# Patient Record
Sex: Male | Born: 1955 | Race: White | Hispanic: No | Marital: Single | Smoking: Former smoker
Health system: Southern US, Community
[De-identification: ages and names within clinical notes are randomized; demographics above are authoritative.]

## PROBLEM LIST (undated history)

## (undated) ENCOUNTER — Emergency Department (HOSPITAL_COMMUNITY): Payer: Medicare Other

## (undated) DIAGNOSIS — N200 Calculus of kidney: Secondary | ICD-10-CM

## (undated) DIAGNOSIS — S21339A Puncture wound without foreign body of unspecified front wall of thorax with penetration into thoracic cavity, initial encounter: Secondary | ICD-10-CM

## (undated) DIAGNOSIS — C801 Malignant (primary) neoplasm, unspecified: Secondary | ICD-10-CM

## (undated) DIAGNOSIS — W3400XA Accidental discharge from unspecified firearms or gun, initial encounter: Secondary | ICD-10-CM

## (undated) DIAGNOSIS — F419 Anxiety disorder, unspecified: Secondary | ICD-10-CM

## (undated) DIAGNOSIS — B1920 Unspecified viral hepatitis C without hepatic coma: Secondary | ICD-10-CM

## (undated) HISTORY — DX: Accidental discharge from unspecified firearms or gun, initial encounter: W34.00XA

## (undated) HISTORY — PX: CERVICAL FUSION: SHX112

## (undated) HISTORY — DX: Anxiety disorder, unspecified: F41.9

## (undated) HISTORY — PX: SHOULDER SURGERY: SHX246

## (undated) HISTORY — PX: KNEE ARTHROSCOPY: SUR90

## (undated) HISTORY — PX: TOTAL HIP ARTHROPLASTY: SHX124

## (undated) HISTORY — PX: OTHER SURGICAL HISTORY: SHX169

## (undated) HISTORY — DX: Puncture wound without foreign body of unspecified front wall of thorax with penetration into thoracic cavity, initial encounter: S21.339A

---

## 1998-08-23 ENCOUNTER — Emergency Department (HOSPITAL_COMMUNITY): Admission: EM | Admit: 1998-08-23 | Discharge: 1998-08-23 | Payer: Self-pay | Admitting: Emergency Medicine

## 1998-09-24 ENCOUNTER — Emergency Department (HOSPITAL_COMMUNITY): Admission: EM | Admit: 1998-09-24 | Discharge: 1998-09-24 | Payer: Self-pay

## 2001-07-02 ENCOUNTER — Encounter: Payer: Self-pay | Admitting: Orthopedic Surgery

## 2001-07-02 ENCOUNTER — Encounter: Admission: RE | Admit: 2001-07-02 | Discharge: 2001-07-02 | Payer: Self-pay | Admitting: Orthopedic Surgery

## 2001-11-04 ENCOUNTER — Encounter: Payer: Self-pay | Admitting: Orthopedic Surgery

## 2001-11-08 ENCOUNTER — Encounter: Payer: Self-pay | Admitting: Orthopedic Surgery

## 2001-11-08 ENCOUNTER — Inpatient Hospital Stay (HOSPITAL_COMMUNITY): Admission: RE | Admit: 2001-11-08 | Discharge: 2001-11-11 | Payer: Self-pay | Admitting: Orthopedic Surgery

## 2002-02-26 ENCOUNTER — Ambulatory Visit (HOSPITAL_COMMUNITY): Admission: RE | Admit: 2002-02-26 | Discharge: 2002-02-26 | Payer: Self-pay | Admitting: Orthopedic Surgery

## 2002-02-26 ENCOUNTER — Encounter: Payer: Self-pay | Admitting: Orthopedic Surgery

## 2002-04-01 ENCOUNTER — Encounter: Payer: Self-pay | Admitting: Emergency Medicine

## 2002-04-01 ENCOUNTER — Emergency Department (HOSPITAL_COMMUNITY): Admission: EM | Admit: 2002-04-01 | Discharge: 2002-04-01 | Payer: Self-pay | Admitting: Emergency Medicine

## 2002-07-15 ENCOUNTER — Emergency Department (HOSPITAL_COMMUNITY): Admission: AD | Admit: 2002-07-15 | Discharge: 2002-07-15 | Payer: Self-pay | Admitting: Emergency Medicine

## 2002-07-15 ENCOUNTER — Encounter: Payer: Self-pay | Admitting: Emergency Medicine

## 2002-10-06 ENCOUNTER — Ambulatory Visit (HOSPITAL_BASED_OUTPATIENT_CLINIC_OR_DEPARTMENT_OTHER): Admission: RE | Admit: 2002-10-06 | Discharge: 2002-10-06 | Payer: Self-pay | Admitting: Orthopedic Surgery

## 2003-09-01 ENCOUNTER — Emergency Department (HOSPITAL_COMMUNITY): Admission: EM | Admit: 2003-09-01 | Discharge: 2003-09-01 | Payer: Self-pay

## 2004-02-21 ENCOUNTER — Emergency Department (HOSPITAL_COMMUNITY): Admission: EM | Admit: 2004-02-21 | Discharge: 2004-02-21 | Payer: Self-pay | Admitting: Family Medicine

## 2004-02-25 ENCOUNTER — Emergency Department (HOSPITAL_COMMUNITY): Admission: EM | Admit: 2004-02-25 | Discharge: 2004-02-25 | Payer: Self-pay | Admitting: Family Medicine

## 2004-05-16 ENCOUNTER — Emergency Department (HOSPITAL_COMMUNITY): Admission: EM | Admit: 2004-05-16 | Discharge: 2004-05-16 | Payer: Self-pay | Admitting: Family Medicine

## 2004-06-17 ENCOUNTER — Ambulatory Visit (HOSPITAL_COMMUNITY): Admission: RE | Admit: 2004-06-17 | Discharge: 2004-06-17 | Payer: Self-pay | Admitting: Orthopedic Surgery

## 2004-08-02 ENCOUNTER — Emergency Department (HOSPITAL_COMMUNITY): Admission: EM | Admit: 2004-08-02 | Discharge: 2004-08-02 | Payer: Self-pay | Admitting: Family Medicine

## 2004-11-16 ENCOUNTER — Emergency Department (HOSPITAL_COMMUNITY): Admission: EM | Admit: 2004-11-16 | Discharge: 2004-11-16 | Payer: Self-pay | Admitting: Emergency Medicine

## 2004-12-20 ENCOUNTER — Ambulatory Visit: Payer: Self-pay | Admitting: Physical Medicine & Rehabilitation

## 2004-12-20 ENCOUNTER — Inpatient Hospital Stay (HOSPITAL_COMMUNITY): Admission: RE | Admit: 2004-12-20 | Discharge: 2004-12-25 | Payer: Self-pay | Admitting: Orthopedic Surgery

## 2005-05-02 ENCOUNTER — Ambulatory Visit (HOSPITAL_BASED_OUTPATIENT_CLINIC_OR_DEPARTMENT_OTHER): Admission: RE | Admit: 2005-05-02 | Discharge: 2005-05-02 | Payer: Self-pay | Admitting: Orthopedic Surgery

## 2005-10-15 ENCOUNTER — Ambulatory Visit: Payer: Self-pay | Admitting: Internal Medicine

## 2005-11-12 ENCOUNTER — Ambulatory Visit: Payer: Self-pay | Admitting: Internal Medicine

## 2005-11-12 ENCOUNTER — Encounter (INDEPENDENT_AMBULATORY_CARE_PROVIDER_SITE_OTHER): Payer: Self-pay | Admitting: *Deleted

## 2006-06-17 ENCOUNTER — Emergency Department (HOSPITAL_COMMUNITY): Admission: EM | Admit: 2006-06-17 | Discharge: 2006-06-18 | Payer: Self-pay | Admitting: Emergency Medicine

## 2006-07-02 ENCOUNTER — Ambulatory Visit (HOSPITAL_COMMUNITY): Admission: RE | Admit: 2006-07-02 | Discharge: 2006-07-02 | Payer: Self-pay | Admitting: Specialist

## 2006-08-06 ENCOUNTER — Encounter: Admission: RE | Admit: 2006-08-06 | Discharge: 2006-08-06 | Payer: Self-pay | Admitting: Specialist

## 2006-08-25 ENCOUNTER — Ambulatory Visit: Payer: Self-pay | Admitting: Gastroenterology

## 2006-08-27 ENCOUNTER — Encounter: Admission: RE | Admit: 2006-08-27 | Discharge: 2006-11-25 | Payer: Self-pay | Admitting: Specialist

## 2006-09-28 ENCOUNTER — Ambulatory Visit (HOSPITAL_COMMUNITY): Admission: RE | Admit: 2006-09-28 | Discharge: 2006-09-28 | Payer: Self-pay | Admitting: Gastroenterology

## 2006-11-05 ENCOUNTER — Encounter
Admission: RE | Admit: 2006-11-05 | Discharge: 2006-11-06 | Payer: Self-pay | Admitting: Physical Medicine & Rehabilitation

## 2006-11-06 ENCOUNTER — Ambulatory Visit: Payer: Self-pay | Admitting: Physical Medicine & Rehabilitation

## 2007-03-28 ENCOUNTER — Emergency Department (HOSPITAL_COMMUNITY): Admission: EM | Admit: 2007-03-28 | Discharge: 2007-03-28 | Payer: Self-pay | Admitting: Emergency Medicine

## 2007-04-01 ENCOUNTER — Encounter: Payer: Self-pay | Admitting: Emergency Medicine

## 2007-04-01 ENCOUNTER — Ambulatory Visit: Payer: Self-pay | Admitting: *Deleted

## 2007-04-01 ENCOUNTER — Inpatient Hospital Stay (HOSPITAL_COMMUNITY): Admission: RE | Admit: 2007-04-01 | Discharge: 2007-04-03 | Payer: Self-pay | Admitting: *Deleted

## 2007-08-05 ENCOUNTER — Emergency Department (HOSPITAL_COMMUNITY): Admission: EM | Admit: 2007-08-05 | Discharge: 2007-08-05 | Payer: Self-pay | Admitting: Emergency Medicine

## 2007-11-23 ENCOUNTER — Inpatient Hospital Stay (HOSPITAL_COMMUNITY): Admission: RE | Admit: 2007-11-23 | Discharge: 2007-11-24 | Payer: Self-pay | Admitting: Neurological Surgery

## 2007-12-08 ENCOUNTER — Emergency Department (HOSPITAL_COMMUNITY): Admission: EM | Admit: 2007-12-08 | Discharge: 2007-12-08 | Payer: Self-pay | Admitting: Emergency Medicine

## 2008-01-29 ENCOUNTER — Emergency Department (HOSPITAL_COMMUNITY): Admission: EM | Admit: 2008-01-29 | Discharge: 2008-01-29 | Payer: Self-pay | Admitting: Emergency Medicine

## 2008-02-21 ENCOUNTER — Ambulatory Visit: Payer: Self-pay | Admitting: Psychiatry

## 2008-02-21 ENCOUNTER — Inpatient Hospital Stay (HOSPITAL_COMMUNITY): Admission: RE | Admit: 2008-02-21 | Discharge: 2008-02-23 | Payer: Self-pay | Admitting: Psychiatry

## 2008-05-16 ENCOUNTER — Emergency Department (HOSPITAL_COMMUNITY): Admission: EM | Admit: 2008-05-16 | Discharge: 2008-05-16 | Payer: Self-pay | Admitting: Family Medicine

## 2009-08-03 ENCOUNTER — Emergency Department (HOSPITAL_COMMUNITY): Admission: EM | Admit: 2009-08-03 | Discharge: 2009-08-03 | Payer: Self-pay | Admitting: Emergency Medicine

## 2009-09-13 ENCOUNTER — Emergency Department (HOSPITAL_COMMUNITY): Admission: EM | Admit: 2009-09-13 | Discharge: 2009-09-13 | Payer: Self-pay | Admitting: Emergency Medicine

## 2010-02-24 ENCOUNTER — Encounter: Payer: Self-pay | Admitting: Gastroenterology

## 2010-05-20 LAB — CBC
HCT: 42.8 % (ref 39.0–52.0)
Hemoglobin: 14.5 g/dL (ref 13.0–17.0)
MCHC: 33.8 g/dL (ref 30.0–36.0)
MCV: 94.6 fL (ref 78.0–100.0)
Platelets: 141 10*3/uL — ABNORMAL LOW (ref 150–400)
RBC: 4.53 MIL/uL (ref 4.22–5.81)
RDW: 14.4 % (ref 11.5–15.5)
WBC: 4.4 10*3/uL (ref 4.0–10.5)

## 2010-05-20 LAB — COMPREHENSIVE METABOLIC PANEL
ALT: 43 U/L (ref 0–53)
AST: 30 U/L (ref 0–37)
Albumin: 3.6 g/dL (ref 3.5–5.2)
Alkaline Phosphatase: 65 U/L (ref 39–117)
BUN: 11 mg/dL (ref 6–23)
CO2: 28 mEq/L (ref 19–32)
Calcium: 8.9 mg/dL (ref 8.4–10.5)
Chloride: 105 mEq/L (ref 96–112)
Creatinine, Ser: 0.73 mg/dL (ref 0.4–1.5)
GFR calc Af Amer: >60 mL/min (ref >60)
GFR calc non Af Amer: >60 mL/min (ref >60)
Glucose, Bld: 98 mg/dL (ref 70–99)
Potassium: 4.4 mEq/L (ref 3.5–5.1)
Sodium: 141 mEq/L (ref 135–145)
Total Bilirubin: 0.5 mg/dL (ref 0.3–1.2)
Total Protein: 6.8 g/dL (ref 6.0–8.3)

## 2010-05-20 LAB — TSH: TSH: 1.8 u[IU]/mL (ref 0.350–4.500)
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
## 2010-06-18 NOTE — H&P (Signed)
NAMESAYAN, ALDAVA            ACCOUNT NO.:  [id number]   MEDICAL RECORD NO.:  [id number]          PATIENT TYPE:  IPS   LOCATION:  0400                          [facility]ILITY[facility]   PHYSICIAN:  Anselm Jungling, MD  DATE OF BIRTH:  [birth year]   DATE OF ADMISSION:  02/21/2008  DATE OF DISCHARGE:                       PSYCHIATRIC ADMISSION ASSESSMENT   This is a 55 year old male, voluntarily admitted on February 21, 2008.   HISTORY OF PRESENT ILLNESS:  The patient is here for history of alcohol  abuse and dependence, drinking up to 18 beers daily or 3 to 4 bottles of  chardonnay.  Last drink was on February 21, 2008.  He wants to get help  for his alcohol use and asking for medications to help with cravings or  Antabuse.  He denies any depression or suicidal thoughts and denies any  other substance use.   PAST PSYCHIATRIC HISTORY:  This is his second admission.  He was here in  February of 2009 for depression and was detoxed off of Xanax at that  time.   SOCIAL HISTORY:  A 55 year old male who lives in [place].  Is  divorced and on disability.   FAMILY HISTORY:  Unknown.   HABITS:  Alcohol and drug history:  Again as above.  Denies any  substance use.  Denies any IV drug use.  Primary care Adelie Croswell is Dr.  Clelia Croft.   PAST MEDICAL PROBLEMS:  1. Had a cervical decompression of C3-C4, C4-C5 in October of 2009.  2. Also has a history of bilateral hip replacements in 2004 and 2006.   Denies any other chronic health conditions.   MEDICATIONS:  None prior to arrival.   DRUG ALLERGIES:  1. SULFA.  2. OXYCODONE.  3. CODEINE.   REVIEW OF SYSTEMS:  Significant for neck pain and unsteady gait.  He  does deny any chest pain, shortness of breath, weight changes, nausea,  vomiting, dysuria, or seizures.   PHYSICAL EXAMINATION:  GENERAL APPEARANCE:  This is a middle-aged male  with a ruddy appearance, appears well-nourished and in no acute  distress.  VITAL SIGNS:  Temperature 97.1,  70 heart rate, 18 respirations, blood  pressure is 154/85, 6 feet tall, 233 pounds.  HEAD, NOSE, AND THROAT:  Ruddy appearance and atraumatic.  NECK:  Limited range of motion.  CHEST:  Clear with no wheezing or rales.  BREASTS:  Deferred.  HEART:  Regular rate and rhythm.  No murmurs or gallops.  ABDOMEN:  Soft, nondistended, and nontender abdomen.  PELVIC AND GU:  Deferred.  EXTREMITIES:  The patient moves all upper and lower extremities with no  tremors noted.  SKIN:  Warm and dry with again a ruddy facial appearance.  NEUROLOGICAL:  Intact and nonfocal.  No tics, no tremors.   LABORATORY DATA:  Shows a C-met within normal limits.  A platelet count  mildly low at 141 with a reference range of 150 to 400.   MENTAL STATUS EXAM:  The patient is fully alert and cooperative.  Good  eye contact.  Speech is clear and normal pace.  He is appropriately  dressed.  Mood is  neutral.  The patient's affect is pleasant and  cooperative.  Thought processes are coherent and goal directed.  Cognitive function intact.  His memory appears to good.  Judgment and  insight are fair.  Poor impulse control related to alcohol use.   ASSESSMENT:  Axis I:  Alcohol dependence, alcohol withdrawal.  Axis II:  Deferred.  Axis III:  Status post cervical decompression to the neck.  Axis IV:  Possible medical problems and other psychosocial issues.  Will  continue to explore his psychosocial impact on illness.  Axis V:  Current is 40.   PLAN:  Detox the patient with Librium protocol.  Work on relapse  prevention.  Will consider Campral, Antabuse, or Revia.  Case manager  will assess and follow up with rehab and relapse prevention.  The  patient be in the red group. We will also monitor the patient's blood  pressure.  Discussion with the patient was done.  The patient reports  having high blood pressure at times, but not being on any medication.  We will again monitor his blood pressure frequently through his  hospital  stay and the patient was advised to follow up with Dr. Clelia Croft if blood  pressure remains elevated.  His tentative length of stay at this time is  3 to 5 days.      Landry Corporal, N.P.      Anselm Jungling, MD  Electronically Signed    JO/MEDQ  D:  02/22/2008  T:  02/22/2008  Job:  [id number]

## 2010-06-18 NOTE — Discharge Summary (Signed)
[name], [name]            ACCOUNT NO.:  [id number]   MEDICAL RECORD NO.:  [id number]          PATIENT TYPE:  IPS   LOCATION:  0300                          [facility]ACILITY:  [facility]   PHYSICIAN:  [name], M.D. DATE OF BIRTH:  Jun 03, 1955   DATE OF ADMISSION:  02/21/2008  DATE OF DISCHARGE:  02/23/2008                               DISCHARGE SUMMARY   IDENTIFICATION:  This is a 55 year old divorced white male who was  admitted on a voluntary basis on February 21, 2008.   HISTORY OF PRESENT ILLNESS:  The patient is here for history of alcohol  abuse and dependence, drinking up to 18 beers daily or 3 to 4 bottles of  chardonnay.  His last drink was on February 21, 2008.  He wants to get  help for his alcohol use and was asking for medications to help with  cravings were Antabuse.  He denies any depression or suicidal thoughts  and denied any other substance use.   PAST PSYCHIATRIC HISTORY:  This is the his 2nd admission.  He was here  in February 2009 for depression and was detoxed off Xanax at that time.  He has had no current treatment.   FAMILY HISTORY:  Unknown.   ALCOHOL AND DRUG HISTORY:  As above.  He denies any substance use.  He  denies any IV drug use.   PAST MEDICAL PROBLEMS.:  1. He had a cervical decompression of C3-C4 and C4-C5 in October 2009.  2. Also has a history of bilateral hip replacements in 2004 and in      2006.  3. Denies any other chronic health conditions.   MEDICATIONS:  None prior to arrival.   DRUG ALLERGIES:  1. SULFA.  2. OXYCODONE.  3. CODEINE.   PHYSICAL FINDINGS:  There were no acute physical or medical problems  noted.   LABORATORY DATA:  Shows a C-MET within normal limits.  Platelet count  mildly low at 141 with a reference range of 150 to 400.   HOSPITAL COURSE:  Upon admission, the patient was started on trazodone  50 mg p.o. q.h.s. p.r.n. insomnia.  He was also started on Librium detox  protocol, though he refused to take this  after 2 doses.  He did not feel  he needed to be withdrawn from alcohol since he states he just binge  drinks.  There were no symptoms of withdrawal.  He was alert, pleasant,  nontremulous.  Mood was neutral.  Affect appropriate.  He wants help.  He wanted the Antabuse.  On February 23, 2008, the patient reiterated he  did not want to take the Librium.  He does not feel he drinks frequently  enough to require detox.  He admits to binge drinking.  He wants to go  home today because he has a court hearing tomorrow for DUI.  He was  started on Antabuse 250 mg per day at his request.  It was felt he was  safe for discharge.  Appetite was good.  Sleep was fair.  Mood was  euthymic.  Affect consistent with mood.  There was no suicidal  or  homicidal ideation.  No thoughts of self-injurious behavior.  No  auditory or visual hallucinations.  No paranoia or delusions.  Thoughts  were logical and goal-directed.  Thought content no predominant theme.  Cognitive was grossly intact.  He planned for his son to pick him up.  He stated he was going to attend AA meetings regularly.  He was also  referred to the [name].   DISCHARGE DIAGNOSES:  Axis I:  Alcohol dependence.  Axis II:  None.  Axis III:  Status post cervical decompression to the neck.  Axis IV:  Moderate (medical problems and other psychosocial issues,  burden of chemical dependence).  Axis V:  Global assessment of functioning was 50 upon discharge.  GAF  was 40 upon admission.  GAF highest past year was 65.   DISCHARGE PLANS:  There was no specific activity level or dietary  restrictions.   POSTHOSPITAL CARE PLANS:  The patient will go to the [name] for  their IOP program on January 21st at 9 a.m.  He will also go to [name] daily.   DISCHARGE MEDICATIONS:  He needs a follow up with Dr. Sherryll Byrd, his primary  care doctor for his blood pressure, which was somewhat elevated.  Antabuse 250 mg p.o. q.a.m.      [name], M.D.  Electronically Signed     BHS/MEDQ  D:  02/23/2008  T:  02/24/2008  Job:  [id number]

## 2010-06-18 NOTE — H&P (Signed)
[name], [name]            ACCOUNT NO.:  [id number]   MEDICAL RECORD NO.:  [id number]           PATIENT TYPE:   LOCATION:                                 FACILITY:   PHYSICIAN:  Jasmine Pang, M.D. DATE OF BIRTH:  1955-07-22   DATE OF ADMISSION:  04/02/2007  DATE OF DISCHARGE:                       PSYCHIATRIC ADMISSION ASSESSMENT   A 55 year old male who was voluntarily admitted on April 01, 2007.   HISTORY OF PRESENT ILLNESS:  The patient reports that he has been  feeling depressed, having problems with anxiety.  He states that his  anxiety has been worsening over the past week.  Last week, due to his  panic attacks, he had taken some extra Xanax to help out with his panic  attacks.  He states that it was a stupid thing to do.  He has been  tapering off his Xanax.  He no longer wanted to be on this medication,  and states that he is down to 1 mg daily.  He has been having trouble  sleeping.  He was having some suicidal thoughts, trying to get off the  Xanax.  He states that when he was trying to get off the Xanax, he was  having such panic attacks he walked from [place] to [place] during  a panic attack.  He denies any hallucinations.  He feels he is not to be  detoxed at this time from his medications.   PAST PSYCHIATRIC HISTORY:  First admission to the [facility].  He was seen in the emergency room department on February 22  after he had to took 6 Xanax tablets and was released.  He does not see  a psychiatrist or therapist outpatient.   SOCIAL HISTORY:  He is a divorced male, disabled.  Per chart has a DUI  with a court date pending in March.  He states that he has good social  support, has 6 grandchildren.   FAMILY HISTORY:  None that we are aware of.   SOCIAL HISTORY:  The patient smokes.  Denies any alcohol or drug use.  Primary care [name] is [facility].  Sees Dr.  __________ . 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   MEDICAL PROBLEMS:  The patient reports multiple orthopedic surgeries to  his hips, knees, and shoulder.   MEDICATIONS:  Has been on Xanax 20 mg q.i.d.  The patient has been  tapering himself down to 1 mg daily.  He takes folic acid tablet.   DRUG ALLERGIES:  SULFA, OXYCODONE, and CODEINE.   PHYSICAL EXAM:  GENERAL: This is a middle-aged male who was assessed in  the emergency room.  VITAL SIGNS:  Temperature is 97.8, heart rate 67, respirations 18, blood  pressure is 156/88.  He is 6 feet 2 inches tall, 218 pounds.  ER notes,  the patient has swollen lymph nodes.  The nursing assessment indicates  that the patient has multiple tattoos.  CBC shows a platelet count of  127.  Urinalysis is negative.  Urine drug screen is positive for  benzodiazepines.  Alcohol level less than 5, glucose of 146.  An x-ray  of his right arm was done.  The patient had reported a prior fracture to  his right arm.  Right elbow shows no fracture.  There are some mild  changes.   MENTAL STATUS EXAM:  He is a middle-aged male fully alert, good eye  contact, casually dressed.  Speech is clear, normal pace and tone.  Affect:  The patient initially is somewhat irritable about his  medications.  As the interview progressed the patient had a sense humor,  was agreeable to having monitoring the patient's symptoms, and was  agreeable to case managers suggestions for a therapist.  Thought process  are coherent.  No evidence any thought disorder.  Cognitive function  intact.  His memory appears to be good.  Judgment and insight is fair.   IMPRESSION:  AXIS I:  Anxiety disorder, not otherwise specified.  AXIS II:  Deferred.  AXIS III:  Multiple orthopedic surgeries.  AXIS IV:  Problems with occupation, medical problems.  AXIS V:  Current is 45-50.   PLANS:  To contract for safety.  Stabilize mood thinking.  Initially the  patient was detoxed.  We will note  to have Librium available on a p.r.n.  basis.  The patient was informed of ER noting swollen neck, and the  patient was informed that he will need to follow up with his primary  care [name] for further evaluation.  The patient appears to  understand.   TENTATIVE LENGTH OF STAY:  2-3 days.      Landry Corporal, N.P.      Jasmine Pang, M.D.  Electronically Signed    JO/MEDQ  D:  04/02/2007  T:  04/03/2007  Job:  [id number]

## 2010-06-18 NOTE — Op Note (Signed)
NAMEMAL, ASHER            ACCOUNT NO.:  [id number]   MEDICAL RECORD NO.:  [id number]          PATIENT TYPE:  INP   LOCATION:  3526                         FACILITY:  [facility]   PHYSICIAN:  Stefani Dama, M.D.  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   OPERATIONS:  Anterior cervical decompression at C3-C4, C4-C5, and C5-C6;  arthrodesis with structural allograft, Alphatec plate fixation with  Trestle.   INDICATIONS:  [name] is a 55 year old individual who has had  significant problems with chronic neck pain, dysesthesias, and numbness  in the upper extremities and myelopathic symptoms in the lower  extremities.  He has been evaluated with an MRI of the cervical spine  that shows that he has evidence of compromise of the spinal canal at C3-  C4, C4-C5, and C5-C6 with biforaminal stenosis noted at C4-C5 and C5-C6.  He has been advised regarding surgical decompression and arthrodesis at  these levels.   PROCEDURE:  The patient was brought to the operating room supine on the  stretcher.  After the smooth induction of general endotracheal  anesthesia, he was placed in 5 pounds of traction.  The neck was then  prepped with alcohol and DuraPrep and draped in the sterile fashion.  A  transverse incision was created in the left side of the neck and carried  down through the platysma plane between the sternocleidomastoid and  strap muscles and dissected bluntly until the prevertebral space was  reached.  The first identifiable disk space was noted be that of C4-C5  on localizing radiograph.  Dissection was then carried in the longus  coli muscle up to C3-C4 and down  to C5-C6.  Caspar retractor was placed  in the wound.  Anterior longitudinal ligament was opened at C4-C5 and  diskectomy was performed at this level using a combination of curettes,  rongeurs, and then a high-speed bur to bur off significant uncinate  process hypertrophy on either lateral aspect.  The posterior  longitudinal ligament was then opened and decompression was taken out  into the foramen on either side.  Once these areas were secured and  hemostasis was achieved, then the C5 nerve root was well decompressed.  The interspace was sized for an appropriate fitting bone graft and a  transgraft from Alphatec was shaved from the 7-mm size to fit  appropriately into the interspace.  It was filled then with  demineralized bone matrix and tamped into the interspace and countersunk  slightly.  Attention was then turned to C3-C4 where similar procedure  was carried out.  The findings were similar and that there was some  hypertrophy in the uncinate processes on either side and the posterior  longitudinal ligament was noted be thickened.  The area was decompressed  in the same fashion.  The same 7-mm size graft was placed  into the  interspace and countersunk appropriately.  Attention was then turned to  C5-C6 where similar procedure was carried out.  Here, there was noted to  be some substantially more uncinate hypertrophy in the lateral gutters  and this required significant drilling to decompress the C6 nerve roots  in the lateral portion of the space.  The posterior central portion was  decompressed similarly and the posterior longitudinal ligament was  opened completely from side to side.  Hemostasis was again achieved and  here an 8-mm size Alphatec graft was then shaved to the appropriate size  and position and filled with demineralized bone matrix, placed into the  interspace and countersunk appropriately.  Traction was removed at this  point and then a 54-mm Trestle plate was fitted  to the ventral aspect of  the vertebral bodies and secured sequentially with 14-mm variable angle  screws from C3 down to C6.  Once this was finally locked down into  position, the wound was checked for hemostasis and a final localizing  radiograph identified good  position of the surgical hardware and the platysma was closed with 3-0  Vicryl in an interrupted fashion and 3-0 Vicryl subcuticular tissues.  Blood loss for the procedure was estimated 100 mL.  The patient  tolerated the procedure well and was returned to the recovery room in  stable condition.      Stefani Dama, M.D.  Electronically Signed     HJE/MEDQ  D:  11/23/2007  T:  11/24/2007  Job:  [id number]

## 2010-06-21 NOTE — Op Note (Signed)
NAMEDESMEN, SCHOFFSTALL            ACCOUNT NO.:  [id number]   MEDICAL RECORD NO.:  [id number]          PATIENT TYPE:  INP   LOCATION:  0007                         FACILITY:  [facility]   PHYSICIAN:  Ollen Gross, M.D.    DATE OF BIRTH:  [birth year]   DATE OF PROCEDURE:  12/20/2004  DATE OF DISCHARGE:                                 OPERATIVE REPORT   PREOPERATIVE DIAGNOSIS:  Osteoarthritis, left hip.   POSTOPERATIVE DIAGNOSIS:  Osteoarthritis, left hip.   PROCEDURE:  Left total hip arthroplasty.   SURGEON:  Ollen Gross, M.D.   ASSISTANT:  Avel Peace, P.A.-C.   ANESTHESIA:  General.   ESTIMATED BLOOD LOSS:  2000.   DRAINS:  Hemovac x1.   COMPLICATIONS:  None.   CONDITION:  Stable to recovery.   BRIEF CLINICAL NOTE:  [name] is a 55 year old male who has had a previous  right total hip arthroplasty who presented with significant right hip pain  and mechanical symptoms and underwent a left hip arthroscopy. He did well  initially but then he had multiple falls due to his right leg giving out. He  has gone on to develop more degenerative change in the left hip. At the time  of arthroscopy, he had very large chondral defects. Due to the progressively  worsening pain and worsening changes in the hip at this point, he presents  now for a total hip arthroplasty.   PROCEDURE IN DETAIL:  After successful administration of general anesthetic,  the patient is placed in the right lateral decubitus position with the left  side up and held with a hip positioner. The left lower extremity is isolated  from his perineum with plastic drapes and prepped and draped in the usual  sterile fashion. A standard posterolateral incision is made with a 10 blade  through the tissue to the level of the fascia lata which was incised in line  with the skin incision. The sciatic nerve was palpated and protected and the  short rotators isolated off the femur. Upon isolating the rotators, we  encountered a  tremendous amount of high-volume bleeding. There was a large  varicoele vein in the quadratus femoris muscle. He rapidly lost over a liter  of blood within a minute.  I was able to identify the source and then we  cauterized it and effectively coagulated it. He was stable throughout this.  We then performed a capsulectomy and subsequently dislocated the hip. The  trial prosthesis was placed such that the center of the trial head  corresponds to the center of his native femoral head. The osteotomy line is  marked on the femoral neck and osteotomy made with an oscillating saw. The  femoral head is removed and the femur retracted anteriorly to gain  acetabular exposure. Acetabular reaming starts at a 47 coursing in  increments of 2 mm up to 59 mm and then a 60 mm pinnacle shell was placed in  anatomic position with excellent purchase. We then placed three additional  dome screws with excellent purchase. The trial 36 mm neutral liner was then  placed.   The femur was repaired  with the canal finder and the irrigation. Axial  reaming was performed to 15.5 mm, proximal reaming to a 20D and the sleeve  machined to a large. The 20D large trial sleeve is placed and a 20 x 15 stem  and a 36 plus 8 neck. With the plus 8 we noted he did not have enough  offset, we went to a 36 plus 12. We matched his native anteversion. The 36  plus 0 head is placed. On his other side, he was a lengthened quite a bit.  With the 36 plus 0 head, we did not effectively get him back to the length  that he was on the other side. We went with a 36 plus 3 head which got him  more appropriately tensioned. He had great stability, full extension, full  external rotation, 70 degrees flexion, 40 degrees adduction, 90 degrees  internal rotation and 90 degrees of flexion and about 50 degrees of internal  rotation. By placing the left leg on top of the right, they were very close  to being equal. The trials were all removed and then  the permanent apex hole  eliminator is placed into the acetabular shell. The permanent 36 mm neutral  Ultamet metal liner is placed. It is a metal-on-metal hip replacement. The  20D large sleeve is then placed in the proximal femur with a 20 x 15 stem  and a 36 plus 12 neck. Again we matched the native anteversion. A 36 plus 3  head is placed and the hip is reduced with the same stability parameters.  The wound was copiously irrigated with saline solution. We checked to make  sure that adequate coagulation had been achieved and at this point it was.  We placed some Gelfoam down at the area where that previous varicoele vein  was. The rotators then reattached to the femur through drill holes with  Ethibond suture. The fascia lata was closed over a Hemovac drain with  interrupted #1 Vicryl, subcu closed with #1 and #2-0 Vicryl and subcuticular  running 4-0 Monocryl. The incision was cleaned and dried and the drain  hooked to suction. A bulky sterile dressing is applied and he is placed into  a knee immobilizer, awakened and transported to recovery in stable  condition.      Ollen Gross, M.D.  Electronically Signed     FA/MEDQ  D:  12/20/2004  T:  12/20/2004  Job:  [id number]

## 2010-06-21 NOTE — Discharge Summary (Signed)
[name], [name]            ACCOUNT NO.:  [id number]   MEDICAL RECORD NO.:  [id number]          PATIENT TYPE:  IPS   LOCATION:  0300                          FACILITY:  [facility]   PHYSICIAN:  Jasmine Pang, M.D. DATE OF BIRTH:  May 10, 1955   DATE OF ADMISSION:  02/21/2008  DATE OF DISCHARGE:  02/23/2008                               DISCHARGE SUMMARY   IDENTIFICATION:  This is a 55 year old divorced male who was admitted on  a voluntary basis on February 21, 2008.   HISTORY OF PRESENT ILLNESS:  The patient is here for a history of  alcohol abuse and dependence, drinking up to 18 beers daily or 3 to 4  bottles of chardonnay; last drink was on February 21, 2008.  He wants to  get help for his alcohol use and is asking for medications to help with  cravings or Antabuse.  He denies any depression or suicidal thoughts.  He denies any other substance use.   PAST PSYCHIATRIC HISTORY:  This is his second admission.  He was here in  February 2009 for depression, was detoxed off Xanax at that time.   FAMILY HISTORY:  Unknown.   ALCOHOL AND DRUG HISTORY:  Again as above.  He denies any substance use.  He denies any IV drug use.   PAST MEDICAL PROBLEMS:  The patient has cervical decompression of C3  through C4 and C4 through C5 in October 2009.  He also has a history of  bilateral hip replacements in 2004 and 2006.  He denies any other  chronic health conditions.   MEDICATIONS:  None prior to arrival.   DRUG ALLERGIES:  1. SULFA.  2. OXYCODONE.  3. CODEINE.   PHYSICAL FINDINGS:  There were no acute physical or medical problems  noted.   Laboratory data shows a CMET within normal limits.  A platelet count  mildly low at 141 with reference range of 150-400.   HOSPITAL COURSE:  Upon admission, the patient was placed on trazodone 50  mg p.o. q.h.s. p.r.n. insomnia and the Librium detox protocol.  He  tolerated these medications well with no significant side effects.  In  individual  sessions, the patient was friendly and cooperative.  He  stated he was here for alcohol detox.  He was pleasant, nontremulous.  There were no signs of psychosis.  No suicidal ideation.  He wants help.  He wants Antabuse.  On February 23, 2008, mental status had improved  markedly from admission status.  He stated he did not want to take  Librium.  He does not feel that he drinks frequently enough to require  detox.  He admits to more of a binge drinking pattern.  He wanted to go  home today because he has a court hearing the following day for DUI.  Sleep was fair.  Appetite was good.  Mood was euthymic.  Affect  consistent with mood.  There was no suicidal or homicidal ideation.  No  auditory or visual hallucinations.  No paranoia or delusions.  Thoughts  were logical and goal-directed, thought content.  No predominant theme.  Cognitive was  grossly intact.  Insight good, judgment good, impulse  control good.  It was decided to prescribe Antabuse 250 mg daily at his  request.   DISCHARGE DIAGNOSES:  Axis I:  Alcohol dependence.  Axis II:  None.  Axis III:  Status post cervical decompression to the neck.  Axis IV:  Moderate (possible medical problems and other psychosocial  issues).  Axis V:  Global assessment of functioning was 50 on discharge.  GAF was  40 upon admission.  GAF highest past year was 60-65.   DISCHARGE PLAN:  There was no specific activity level or dietary  restrictions.   POSTHOSPITAL CARE PLANS:  The patient will go to the [facility] on February 24, 2008, at 9 a.m. for a walk-in appointment.  He will  also follow up with Dr. Jabier Mutton for blood pressure.   DISCHARGE MEDICATIONS:  Antabuse 250 mg daily in the a.m.      Jasmine Pang, M.D.  Electronically Signed     BHS/MEDQ  D:  03/24/2008  T:  03/25/2008  Job:  [id number]

## 2010-06-21 NOTE — Op Note (Signed)
[name], [name]                        ACCOUNT NO.:  [id number]   MEDICAL RECORD NO.:  [id number]                   PATIENT TYPE:  INP   LOCATION:  2873                                 FACILITY:  [facility]   PHYSICIAN:  Harvie Junior, M.D.                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   OPERATION PERFORMED:  Right total hip replacement with SROM instrumentation  36 head with metal on metal pinnacle shell 56 mm.   SURGEON:  Harvie Junior, M.D.   ASSISTANT:  1. Feliberto Gottron. Turner Daniels, M.D.  2. Currie Paris. Thedore Mins.   ANESTHESIA:  General.   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  He ultimately has been living with this and dealing with  it for a long period of time with pain for greater than 15 years.  The pain  has continued to increase over the last several years and because of  continued complaints of worsening of pain, the patient was ultimately  evaluated and felt to need some sort of surgical procedure.  We talked about  cuneiform style osteotomy, the unpredictable nature of that in terms of  success.  We brought him in and evaluated him at length and tried to obtain  a pain free position.  We really could not find a pain free position.  Because we could not find a pain free position, it was unclear what kind of  osteotomy would allow him to get into a pain free position.  We talked about  further treatment options.  It was felt that nonoperative treatment was  really not something he could  continue to endure painwise and so ultimately  we talked about doing a total hip replacement and his decision was to go  with this.  We talked about bearing options given his young age.  We talked  about metal on metal.  We talked about the problems of metal on metal with  concerns of a metal ion layer over time.  We talked about metal on  polyethylene as the gold standard.  We talked about ceramic hip, ceramic on  ceramic.  Ultimately, after discussion, he said that he would like a metal  on metal hip as he thought that this was more like a ball bearing and he  felt that ball bearing certainly not wear out and again we discussed the  metal ion situation with him, unknown about the possibility of cancer  causing agents and he ultimately understood this risk and his decision to  proceed with a metal on metal total hip.  He was brought to the operating  room for this procedure.   DESCRIPTION OF PROCEDURE:  The patient was brought to the operating room  where adequate anesthesia was obtained with general anesthetic.  The patient  was placed into the left lateral decubitus position.  All bony prominences  were well padded.  At this point attention was turned to the right hip where  after routine prep and drape an incision was made for a posterior approach  to the hip.  Subcutaneous tissues were dissected down to the level of the  tensor fascia which was divided in line with the fibers.  Gluteus maximus  was finger fractured.  The posterior capsule was then identified.  The  posterior  capsular structures were then opened.  The piriformis tendon was  taken down.  The short external rotators were taken down and the capsule was  opened and tack sutures were placed both superiorly and inferiorly in the  capsule.  At this point attention was turned towards the hip where the hip  was dislocated.  A provisional neck cut was made.  The neck cut was made  approximately one fingerbreadth above the  lesser trochanter.  At this time  the acetabulum was sequentially reamed to a level of 55 mm.  A 56 cup was  put in place.  Wonderful fixation was achieved. It could essentially lift  the patient off of the bed.  There was a small area centrally where the cup  was still proud.  The rim lock was tremendous and it was felt that this was  certainly fine as there was no concern about the stability of the cup.  At  this point attention was turned towards the trial liner, 36 and attention  was turned toward the stem side.  The stem was sequentially reamed to 15,  halfway down with 15-5.  The cone was then reamed to a 20B cone and small  was felt to be the appropriate size on the calcar.  Following this the trial  stem was put in place as well as the 36 ball and it was put through a  reduction and certainly felt tight.  It was felt that this would be a good  case given that we were trying to lengthen him almost half an inch.  At this  point, the hip was perfectly stable, no issues or problems with stability.  The components were then removed.  The final cone 20B small was then  hammered into place.  The stem was then put in with neutral version as has  been previously selected from its stability and the 36 metal head was then  placed.  Prior to placement of this, the final liner had been placed and had  been hammered into place with the morris taper.  At this point the stem 36  ball was put in place.  It was trial reduced, care being taken not to  scratch the ball as it went into reduction and excellent stability was  achieved.  At this point the hip was copiously irrigated and suctioned dry.  The tensor fascia was closed with a run Vicryl running locking suture.  Prior to this closure, the capsule as well as short external rotators and  piriformis were attached to the trochanter through drill holes.  The  piriformis was then closed as outlined above.  The subcu was closed with 0 and 2-0 Vicryl  and the skin with skin staples.  A sterile and compressive  dressing was applied at this point.  The patient was taken to the recovery  room where he was noted to be in satisfactory condition.  The estimated  blood loss for this procedure was 500 cc.                                               Harvie Junior, M.D.    Ranae Plumber  D:  11/08/2001  T:  11/09/2001  Job:  [id number]
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
## 2010-06-21 NOTE — Op Note (Signed)
NAME:  [name], [name]                      ACCOUNT NO.:  [id number]   MEDICAL RECORD NO.:  [id number]                   PATIENT TYPE:  AMB   LOCATION:  DSC                                  FACILITY:  [facility]   PHYSICIAN:  Loreta Ave, M.D.              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   PROCEDURE:  Right shoulder exam under anesthesia, arthroscopy, debridement  of rotator cuff and bursa, acromioplasty, CA ligament release, excision  distal clavicle.   SURGEON:  Loreta Ave, M.D.   ASSISTANT:  Arlys John D. Petrarca, P.A.-C.   ANESTHESIA:  General.   ESTIMATED BLOOD LOSS:  Minimal.   SPECIMENS:  None.   CULTURES:  None.   COMPLICATIONS:  None.   DRESSING:  Soft compressive with sling.   DESCRIPTION OF PROCEDURE:  The patient was brought to the operating room and  placed on the operating table in the supine position. After adequate  anesthesia had been obtained the shoulder was examined. Full motion and good  stability. Placed in the beachchair position on the shoulder positioner,  prepped and draped in the usual sterile fashion.   Three portals were created, anterior, posterior  and lateral shoulder  portals. The shoulder underwent  bone obturator, distended and inspected.  The interior  of the shoulder looked good with intact cartilage, labrum,  ligaments, biceps tendon, biceps anchor and undersurface rotator cuff.   The cannula was redirected subacromially. Marked impingement with type 2 to  3  acromion, marked reactive bursitis and abrasive  tearing on the top of  cuff. Grade 4 changes of the acromioclavicular joint with significant  spurring and osteolysis. The bursa was resected. The cuff was debrided. No  full thickness tears.   Acromioplasty to a type 1 acromion with a shaver  and a high-speed bur  releasing the CA ligament with cautery. A lateral centimeter of clavicle  resected with a shaver and a high-speed but .The adequacy of acromioplasty  and clavicle excision confirmed viewing from all portals. The cuff was  thoroughly debrided and inspected.   The instruments were further  removed. The portals and the shoulder were  injected with Marcaine. The portals were closed with 4-0 nylon. A sterile  compressive dressing was applied.   Anesthesia was reversed. Brought to the recovery room. Tolerated  the  surgery well with no complications.  Loreta Ave, M.D.    DFM/MEDQ  D:  10/06/2002  T:  10/07/2002  Job:  [id number]
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
## 2010-06-21 NOTE — Discharge Summary (Signed)
[name], [name]            ACCOUNT NO.:  [id number]   MEDICAL RECORD NO.:  [id number]          PATIENT TYPE:  IPS   LOCATION:  0306                          FACILITY:  [facility]   PHYSICIAN:  Jasmine Pang, M.D. DATE OF BIRTH:  [birth year]   DATE OF ADMISSION:  04/01/2007  DATE OF DISCHARGE:  04/03/2007                               DISCHARGE SUMMARY   IDENTIFICATION:  This was a 55 year old male who was admitted on a  voluntary basis on April 01, 2007.  He is divorced and disabled.   HISTORY OF PRESENT ILLNESS:  The patient reports he has been feeling  depressed and having problems with anxiety.  He states his anxiety has  been worsening over the past week.  Last week due to his panic attacks,  he had taken an extra Xanax to help with panic attacks.  He stated it  was a stupid thing to do.  He has been tapering off Xanax.  He no  longer wanted to be on this medication.  He stated he was down to 1 mg  daily.  He has been having trouble sleeping.  He was having some  suicidal thoughts trying to get off the Xanax.  He states that when he  was trying to get off the Xanax, he was having such panic attacks that  he walked from [place] to [place] during a panic attack.  He  denies any hallucinations.  He feels he is not detoxed at this time from  his medications.   PAST PSYCHIATRIC HISTORY:  This is the first admission to [facility].  The patient was seen in the emergency room on February  22 after he took 6 Xanax tablets and was released.  He does not see a  psychiatrist or therapist outpatient.   FAMILY HISTORY:  None that we are where of.   SUBSTANCE ABUSE:  The patient denies any alcohol or drug use.  He does  smoke.   MEDICAL PROBLEMS:  The patient reports multiple orthopedic surgeries to  his hips, knees, and shoulder.   MEDICATIONS:  He has been on Xanax 2 mg p.o. q.i.d., he had been  tapering himself down to 1 mg daily.  He also takes folic acid  tablet.   DRUG ALLERGIES:  SULFA, OXYCODONE, and CODEINE.   PHYSICAL FINDINGS:  There were no acute physical or medical problems  noted.  He had his physical exam done at [facility].  The patient was  in no distress.  There were some positive swollen lymph nodes in his  inguinal area.   DIAGNOSTIC STUDIES:  CBC revealed a platelet count of 127.  Urinalysis  was negative.  UDS was positive for benzodiazepines.  Alcohol level was  less than 5.  Glucose was 146.   HOSPITAL COURSE:  Upon admission, the patient was placed on alprazolam 1  mg p.o. q.i.d. and folic acid 400 mcg p.o. q. day.  He was also started  on trazodone 50 mg p.o. q.h.s. may repeat x1.  His Xanax was then  discontinued and he was started on Librium detox protocol; however, this  protocol was discontinued due to his lack of withdrawal symptoms in the  fact that he was down to only 1 mg of Xanax daily now.  Instead, he was  placed on Librium 25 mg p.o. q.6 h. p.r.n. anxiety or withdrawal  symptoms.  In individual sessions with me, the patient was friendly and  cooperative.  He stated he was having panic attacks and anxiety.  He  wanted to get off the Xanax.  On April 03, 2007, mental status had  improved markedly from admission status.  His mood was euthymic.  Affect  consistent with mood.  Sleep was fair.  Appetite good.  There was no  suicidal or homicidal ideation.  No thoughts of self-injurious behavior.  No auditory or visual hallucinations.  No paranoia or delusions.  Thoughts were logical and goal directed.  Thought content, no  predominant theme.  Cognitive was grossly back to baseline and was felt  the patient was ready for discharge.  He will be seen by a counselor at  the [facility] for followup treatment.   DISCHARGE DIAGNOSES:  Axis I:  Anxiety disorder, not otherwise  specified.  Axis II:  None.  Axis III:  Multiple orthopedic surgeries.  Axis IV:  Problems with occupation and medical problems,  burden of  psychiatric illness - severe.  Axis V:  Global assessment of functioning upon discharge was 55.  GAF  upon admission was 45.  GAF highest past year was 65-70.   DISCHARGE PLAN:  There was no specific activity level or dietary  restrictions.   POSTHOSPITAL CARE PLANS:  The patient will be seen at the [facility]  for counseling on March 2 at 11 o'clock a.m.  He has a walk-in  appointment.  He will follow up with Dr. Sherryll Burger for his orthopedic  problems.   DISCHARGE MEDICATIONS:  He will continue his folic acid as directed by  Dr. Sherryll Burger.  He was on no other medications at the time of discharge.      Jasmine Pang, M.D.  Electronically Signed     BHS/MEDQ  D:  04/26/2007  T:  04/27/2007  Job:  [id number]
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
## 2010-06-21 NOTE — Op Note (Signed)
NAMESAMER, DUTTON            ACCOUNT NO.:  [id number]   MEDICAL RECORD NO.:  [id number]          PATIENT TYPE:  EMS   LOCATION:  URG                          FACILITY:  [facility]   PHYSICIAN:  Dionne Ano. Gramig III, M.D.DATE OF BIRTH:  [birth year]   DATE OF PROCEDURE:  DATE OF DISCHARGE:  02/25/2004                                 OPERATIVE REPORT   I had the pleasure of seeing [name] upon the referral from the  emergency room staff at [facility].  The patient  presents with a complaint of a SkilSaw lacerating his left hand and fingers.  He has been updated on his past medical history, his tetanus is up to date.  He complains of pain in the left hand, denies other injury.  He denies  history of inflammatory arthritis, gout, pseudogout or other problems.   PAST MEDICAL HISTORY:  He denies significant past medical problems except  for anxiety.   CURRENT MEDICATIONS:  Xanax.   PAST SURGICAL HISTORY:  1.  Total hip arthroplasty.  2.  Back surgery.  3.  Shoulder surgery.  4.  History of a kidney contusion after a fall.   DRUG ALLERGIES:  Sulfa and codeine as well as other antibiotics which he  does not recall the names of; however, these antibiotics gave him a fairly  anaphylactic-type reaction it appears and it may be a cephalosporin or  penicillin upon thorough questioning.  Truly, he does not recall the  antibiotic.   SOCIAL HISTORY:  He smokes a half a pack per day.  He does not drink.  He is  an Personnel officer.   PHYSICAL EXAMINATION:  GENERAL: On examination, he is alert and oriented in  no acute distress.  VITAL SIGNS:  Stable.  EXTREMITIES:  He has a laceration to the left thumb with a nail bed injury  and partial thumb plate injury.  The patient has intact extensor pollicus  longus and flexor pollicus longus.  The index finger has a laceration jagged  in nature about the radial aspect of his finger.  He has poor sensation here  as a  baseline secondary to severe carpal tunnel syndrome.  He also has a  laceration extending to the midline in this region.  This is a very jagged  and complex laceration; however, he does exhibit positive flexor digitorum  profundus activity.  Flexor digitorum superficialis is intact as well.  The  middle finger has a laceration at the distal pulp 1 cm in nature.  The ring  finger has a 2 cm laceration over the ulnar aspect and pulp aspect of the  finger.  Flexor digitorum superficialis and profundus as well as extensors  are intact about the middle, ring and small fingers.  I have reviewed this  at length and its findings.  HEENT:  Within normal limits.  LUNGS:  Clear.  HEART:  Regular.  ABDOMEN:  Nontender.  NEUROLOGICAL:  He walks with a nonantalgic gait.   X-rays are reviewed which show no fracture or dislocation, __________  lesion.   IMPRESSION:  Status post SkilSaw injury with  multiple lacerations as  described above.   PLAN:  For irrigation and debridement and repair of structures as necessary.   PROCEDURE NOTE:  The patient was taken to the operative arena.  I gave him  an intermetacarpal block about the fingers.  Following excellent anesthesia,  I then placed peroxide over the wounds to remove bloody debris.  Following  this, I gave him two separate surgical Betadine scrubs.  This done to my  satisfaction without difficulty, he was then irrigated with greater than 2  liters of saline and had a sterile field secured.  I was meticulous in the  debridement.  Once this was done, the thumb underwent removal of the nail  plate followed by I&D, followed by nail bed repair.  The patient tolerated  this well.  Adaptic was placed on the eponychial fold to prevent nail bed  adherence.  Following this, the index finger was addressed.  Given the  laceration and his numbness, I explored the radial digital nerve which was  highly contused and this underwent a neurolysis.  The ulnar digital  nerve  was not involved.  The flexor tendon was intact.  I irrigated these areas  copiously and following this, repaired the wound with a combination of  chromic and Prolene suture of the 4-0 variety.  He tolerated this well and  did demonstrate flexor digitorum superficialis and profundus muscle  functioning/tendon functioning which looked normal.  Following this, a 1 cm  laceration about the pulp of the middle finger underwent I&D of the skin and  subcutaneous tissue and following this, a complex repair with Prolene.  Following this, the ring finger underwent I&D of the skin and subcutaneous  tissue and complex repair with a Prolene suture was accomplished.  The small  finger was uninjured.  Following this, we lavaged the wound again followed  by placement of Xeroform, Adaptic and a sterile dressing.  He tolerated this  well without complications.  He was discharged home on [address] and  Robaxin.  He has tolerated a Demerol injection today in the emergency room  and thus I feel this is suitable for postoperative pain analgesia.  The  patient and I have discussed these issues at length, these notes, etc.  All  questions have been encouraged and answered.  I have discussed with the  patient that we will plan for followup in five days in the office and  proceed accordingly with dressing change and early range of motion.  All  questions have been encouraged and answered.                                               ______________________________  Dionne Ano. Everlene Other, M.D.    Nash Mantis  D:  02/25/2004  T:  02/25/2004  Job:  [id number]

## 2010-06-21 NOTE — Op Note (Signed)
NAMEPAWEL, SOULES            ACCOUNT NO.:  [id number]   MEDICAL RECORD NO.:  [id number]          PATIENT TYPE:  AMB   LOCATION:  DSC                          FACILITY:  [facility]   PHYSICIAN:  Ollen Gross, M.D.    DATE OF BIRTH:  01/20/1956   DATE OF PROCEDURE:  05/02/2005  DATE OF DISCHARGE:  05/02/2005                                 OPERATIVE REPORT   PREOPERATIVE DIAGNOSIS:  Right knee lateral meniscal tear.   POSTOPERATIVE DIAGNOSIS:  Right knee lateral meniscal tear.   PROCEDURE:  Right knee arthroscopy with lateral meniscal debridement.   SURGEON:  Ollen Gross, M.D.   ASSISTANT:  None.   ANESTHESIA:  General.   ESTIMATED BLOOD LOSS:  Minimal.   DRAINS:  None.   COMPLICATIONS:  None.   CONDITION:  Stable, to Recovery.   BRIEF CLINICAL NOTE:  Kathlene November is a 55 year old male who has previously had a  right knee arthroscopy with meniscal debridement and he did very well after  that.  His dog ran into his leg and he fell, twisting his right knee a few  months ago.  He has had progressively worsening pain and mechanical  symptoms.  His MRI showed a recurrent tear.  He presents now for arthroscopy  and debridement.   PROCEDURE IN DETAIL:  After the successful administration of a general  anesthetic, the patient was placed supine on the operating table and a  tourniquet placed high on his right thigh.  The right lower extremity was  prepped and draped in the usual sterile fashion.  Standard superomedial and  inferolateral incisions were made, inflow passed superomedial and camera  passed inferolateral.  Arthroscopic visualization proceeds.  The  undersurface of the patella looks normal, as does the trochlea.  The medial  and lateral gutters are visualized and there is some synovitis with no loose  bodies.  Flexion and valgus force are applied to the knee and the medial  compartment is entered.  The medial compartment looks normal throughout.  The spinal needle was used  to localize the inferomedial portal.  A small  incision was made, dilator placed and probe placed.  The medial compartment  is normal.  The ACL was visualized and appears normal.  The lateral  compartment is entered and he has got a significant tear of body, posterior  horn and anterior horn of the lateral meniscus, which appears unstable.  It  was debrided back to a stable base with baskets with a 4.2-mm shaver.  The  ArthroCare device was used to seal the edges of the debrided meniscus.  The  joint was again inspected and no other abnormalities.  The arthroscopic  equipment was then removed from the inferior portals,  which are closed with interrupted 4-0 nylon.  Twenty milliliters of 0.25%  Marcaine with epinephrine are injected in through the inflow cannula and  then the cannula was removed and the incision closed with nylon.  A bulky  sterile dressing was applied and he was awakened and transported to Recovery  in stable condition.      Ollen Gross, M.D.  Electronically Signed  FA/MEDQ  D:  05/02/2005  T:  05/05/2005  Job:  [id number]
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
## 2010-06-21 NOTE — Op Note (Signed)
NAMEKYRIAKOS, BABLER            ACCOUNT NO.:  [id number]   MEDICAL RECORD NO.:  [id number]          PATIENT TYPE:  AMB   LOCATION:  DAY                          FACILITY:  [facility]   PHYSICIAN:  Ollen Gross, M.D.    DATE OF BIRTH:  06-10-1955   DATE OF PROCEDURE:  06/17/2004  DATE OF DISCHARGE:                                 OPERATIVE REPORT   PREOPERATIVE DIAGNOSIS:  Left hip labral tear and chondral defect.   POSTOPERATIVE DIAGNOSIS:  Left hip labral tear and chondral defect.   PROCEDURE:  Left hip arthroscopy with labral debridement and chondroplasty.   SURGEON:  Dr. Lequita Halt   ASSISTANT:  Avel Peace, PA-C   ANESTHESIA:  General.   ESTIMATED BLOOD LOSS:  Minimal.   DRAINS:  None.   COMPLICATIONS:  None.   CONDITION:  Stable to recovery.   BRIEF CLINICAL NOTE:  Kathlene November is a 55 year old male, several month history of  progressively worsening left hip pain and mechanical symptoms.  Exam and  history were consistent with probable labral tear confirmed by MRI.  He has  had intractable pain and gets a sensation that the hip is popping out of  joint.  He presents now for arthroscopic debridement.  He has had a previous  right total hip arthroplasty.   PROCEDURE IN DETAIL:  After the successful administration of general  anesthetic, the patient is placed in the right lateral decubitus position  with the left side up, and the well-padded perineal post is placed with the  left leg draped over the perineal post and then the left foot in a well-  padded traction boot.  Longitudinal traction is applied under fluoroscopic  guidance to adequately distract the hip joint.  Once adequately distracted,  the traction is locked in this disposition and the hip prepped and draped in  the usual sterile fashion.  Spinal needles were then placed in the anterior  and posterior peritrochanteric sites.  They both felt into the joint. This  60 mL of saline was then injected through an anterior  needle, and it  effluxes through the posterior, confirming they are both intraarticular.  Some nitinol wires are passed, and then the incision made posteriorly for  the 5 mm dilator and then the 5 mm cannula is placed.  The camera is then  passed into the joint, arthroscopic visualization proceeds.  Immediately  upon entering, it is noted that he has a massive chondral delamination  present anteriorly at the anterior-inferior to anterior-superior margin of  the joint.  There is an associated labral tear with this which appears  unstable.  The fovea looks normal as does the majority the femoral head.  There is a little bit of chondromalacia anteriorly.  The superior labrum  looks normal as well as the entire posterior half of the joint.  We felt  that the spinal needle was in good position anteriorly and then again  dilated it with the 5 mm dilator over the nitinol wire.  I began the  debridement with the shaver and then used a combination of a 4.2 mm shaver  and the ArthroCare  device to debride this back to a stable bony base with  stable cartilaginous edges.  The labrum was also debrided back to a stable  base.  The defect size is about 2 x 2 cm.  Once the debridement is  completed, then the joint is again inspected, and no other abnormalities are  noted.  The arthroscopic equipment is then removed from the anterior portal  and then posteriorly through the inflow cannula, 20 mL of 0.5% Marcaine with  epinephrine are injected.  That portal is then removed.  The traction is  then let off the hip, and I took one fluoro spot to confirm that the hip is  concentrically reduced.  The incisions are then closed with interrupted 4-0  nylon.  Foot is taken out of the traction boot and the peroneal post  removed.  He is then placed supine, extubated, and transported to recovery  in stable condition.      FA/MEDQ  D:  06/17/2004  T:  06/17/2004  Job:  [id number]

## 2010-08-28 ENCOUNTER — Emergency Department (HOSPITAL_COMMUNITY)
Admission: EM | Admit: 2010-08-28 | Discharge: 2010-08-28 | Disposition: A | Discharge status: Home / Self Care | Payer: Medicare Other | Attending: Emergency Medicine | Admitting: Emergency Medicine

## 2010-08-28 ENCOUNTER — Emergency Department (HOSPITAL_COMMUNITY): Payer: Medicare Other

## 2010-08-28 ENCOUNTER — Encounter (HOSPITAL_COMMUNITY): Payer: Self-pay

## 2010-08-28 DIAGNOSIS — M546 Pain in thoracic spine: Secondary | ICD-10-CM | POA: Insufficient documentation

## 2010-08-28 DIAGNOSIS — F411 Generalized anxiety disorder: Secondary | ICD-10-CM | POA: Insufficient documentation

## 2010-08-28 HISTORY — DX: Calculus of kidney: N20.0

## 2010-08-28 LAB — DIFFERENTIAL
Lymphocytes Relative: 35 % (ref 12–46)
Lymphs Abs: 1.1 10*3/uL (ref 0.7–4.0)
Monocytes Relative: 12 % (ref 3–12)
Neutro Abs: 1.7 10*3/uL (ref 1.7–7.7)
Neutrophils Relative %: 52 % (ref 43–77)

## 2010-08-28 LAB — COMPREHENSIVE METABOLIC PANEL
ALT: 211 U/L — ABNORMAL HIGH (ref 0–53)
AST: 124 U/L — ABNORMAL HIGH (ref 0–37)
Alkaline Phosphatase: 78 U/L (ref 39–117)
CO2: 28 mEq/L (ref 19–32)
Chloride: 102 mEq/L (ref 96–112)
GFR calc Af Amer: >60 mL/min (ref >60)
GFR calc non Af Amer: >60 mL/min (ref >60)
Glucose, Bld: 99 mg/dL (ref 70–99)
Potassium: 3.6 mEq/L (ref 3.5–5.1)
Sodium: 136 mEq/L (ref 135–145)
Total Bilirubin: 0.2 mg/dL — ABNORMAL LOW (ref 0.3–1.2)

## 2010-08-28 LAB — CBC
Hemoglobin: 14.5 g/dL (ref 13.0–17.0)
MCH: 33 pg (ref 26.0–34.0)
MCV: 92 fL (ref 78.0–100.0)
RBC: 4.4 MIL/uL (ref 4.22–5.81)

## 2010-08-28 LAB — URINALYSIS, ROUTINE W REFLEX MICROSCOPIC
Glucose, UA: NEGATIVE mg/dL
Hgb urine dipstick: NEGATIVE
Protein, ur: NEGATIVE mg/dL
Specific Gravity, Urine: 1.02 (ref 1.005–1.030)
pH: 6 (ref 5.0–8.0)

## 2010-08-28 MED ORDER — IOHEXOL 300 MG/ML  SOLN
100.0000 mL | Freq: Once | INTRAMUSCULAR | Status: AC | PRN
  Administered 2010-08-28: 100 mL via INTRAVENOUS

## 2010-10-25 LAB — RAPID URINE DRUG SCREEN, HOSP PERFORMED
Amphetamines: NOT DETECTED
Barbiturates: NOT DETECTED
Benzodiazepines: POSITIVE — AB
Benzodiazepines: POSITIVE — AB
Cocaine: NOT DETECTED

## 2010-10-25 LAB — CBC
HCT: 44.6
HCT: 45.4
Hemoglobin: 15.5
MCHC: 34.5
MCV: 93.5
MCV: 94.7
Platelets: 127 — ABNORMAL LOW
Platelets: 144 — ABNORMAL LOW
RDW: 13.6
WBC: 4.4
WBC: 5

## 2010-10-25 LAB — DIFFERENTIAL
Basophils Relative: 2 — ABNORMAL HIGH
Eosinophils Absolute: 0.1
Eosinophils Absolute: 0.1
Eosinophils Relative: 1
Eosinophils Relative: 1
Lymphs Abs: 2.3
Lymphs Abs: 2.9
Monocytes Absolute: 0.4
Monocytes Relative: 9
Neutrophils Relative %: 35 — ABNORMAL LOW

## 2010-10-25 LAB — POCT CARDIAC MARKERS
Myoglobin, poc: 73.9
Operator id: 3206
Troponin i, poc: <0.05

## 2010-10-25 LAB — HEPATIC FUNCTION PANEL
Alkaline Phosphatase: 52
Total Bilirubin: 0.4
Total Protein: 7.2

## 2010-10-25 LAB — ETHANOL: Alcohol, Ethyl (B): 187 — ABNORMAL HIGH

## 2010-10-25 LAB — BASIC METABOLIC PANEL
BUN: 6
BUN: 7
CO2: 26
Chloride: 103
Chloride: 108
Creatinine, Ser: 0.63
Glucose, Bld: 146 — ABNORMAL HIGH
Potassium: 3.9
Potassium: 3.9
Sodium: 138

## 2010-10-25 LAB — URINALYSIS, ROUTINE W REFLEX MICROSCOPIC
Bilirubin Urine: NEGATIVE
Glucose, UA: NEGATIVE
Hgb urine dipstick: NEGATIVE
Ketones, ur: NEGATIVE
Protein, ur: NEGATIVE

## 2010-11-04 LAB — HEPATIC FUNCTION PANEL
ALT: 60 — ABNORMAL HIGH
AST: 41 — ABNORMAL HIGH
Albumin: 3.7
Alkaline Phosphatase: 62
Bilirubin, Direct: <0.1
Total Bilirubin: 0.3
Total Protein: 7.2

## 2010-11-04 LAB — CBC
HCT: 45.7
Hemoglobin: 15.7
MCHC: 34.4
MCV: 96.3
Platelets: 134 — ABNORMAL LOW
RBC: 4.74
RDW: 13.5
WBC: 3.9 — ABNORMAL LOW

## 2010-11-05 LAB — CBC
HCT: 40
Hemoglobin: 14
MCV: 94.5
Platelets: 173
RBC: 4.24
WBC: 5

## 2010-11-05 LAB — DIFFERENTIAL
Eosinophils Absolute: 0.1
Eosinophils Relative: 2
Lymphocytes Relative: 51 — ABNORMAL HIGH
Lymphs Abs: 2.5
Monocytes Absolute: 0.4
Monocytes Relative: 9

## 2010-11-05 LAB — BASIC METABOLIC PANEL
BUN: 18
Chloride: 107
GFR calc Af Amer: >60
GFR calc non Af Amer: >60
Potassium: 3.9
Sodium: 140

## 2010-11-08 LAB — BLOOD GAS, ARTERIAL
Acid-base deficit: 0.3 mmol/L (ref 0.0–2.0)
FIO2: 0.21 %
Patient temperature: 98.6
TCO2: 19.8 mmol/L (ref 0–100)
pCO2 arterial: 36.9 mmHg (ref 35.0–45.0)
pH, Arterial: 7.417 (ref 7.350–7.450)

## 2010-11-08 LAB — URINALYSIS, ROUTINE W REFLEX MICROSCOPIC
Bilirubin Urine: NEGATIVE
Hgb urine dipstick: NEGATIVE
Ketones, ur: NEGATIVE mg/dL
Specific Gravity, Urine: 1.003 — ABNORMAL LOW (ref 1.005–1.030)
pH: 6 (ref 5.0–8.0)

## 2010-11-08 LAB — DIFFERENTIAL
Eosinophils Absolute: 0.1 10*3/uL (ref 0.0–0.7)
Lymphs Abs: 2.9 10*3/uL (ref 0.7–4.0)
Monocytes Relative: 9 % (ref 3–12)
Neutro Abs: 3.1 10*3/uL (ref 1.7–7.7)
Neutrophils Relative %: 46 % (ref 43–77)

## 2010-11-08 LAB — POCT I-STAT, CHEM 8
Chloride: 106 mEq/L (ref 96–112)
Creatinine, Ser: 1 mg/dL (ref 0.4–1.5)
Glucose, Bld: 101 mg/dL — ABNORMAL HIGH (ref 70–99)
HCT: 52 % (ref 39.0–52.0)
Hemoglobin: 17.7 g/dL — ABNORMAL HIGH (ref 13.0–17.0)
Potassium: 4.1 mEq/L (ref 3.5–5.1)
Sodium: 141 mEq/L (ref 135–145)

## 2010-11-08 LAB — CBC
MCV: 94.3 fL (ref 78.0–100.0)
Platelets: 198 10*3/uL (ref 150–400)
RBC: 5.17 MIL/uL (ref 4.22–5.81)
WBC: 6.7 10*3/uL (ref 4.0–10.5)

## 2010-11-08 LAB — RAPID URINE DRUG SCREEN, HOSP PERFORMED
Amphetamines: NOT DETECTED
Cocaine: NOT DETECTED
Opiates: NOT DETECTED
Tetrahydrocannabinol: POSITIVE — AB

## 2010-11-08 LAB — CARBOXYHEMOGLOBIN
Methemoglobin: 1.5 % (ref 0.0–1.5)
Total hemoglobin: 16.9 g/dL (ref 13.5–18.0)

## 2010-11-26 ENCOUNTER — Encounter: Payer: Self-pay | Admitting: Internal Medicine

## 2011-02-10 DIAGNOSIS — Z0000 Encounter for general adult medical examination without abnormal findings: Secondary | ICD-10-CM | POA: Diagnosis not present

## 2011-02-10 DIAGNOSIS — F411 Generalized anxiety disorder: Secondary | ICD-10-CM | POA: Diagnosis not present

## 2011-02-10 DIAGNOSIS — E785 Hyperlipidemia, unspecified: Secondary | ICD-10-CM | POA: Diagnosis not present

## 2011-02-10 DIAGNOSIS — F17200 Nicotine dependence, unspecified, uncomplicated: Secondary | ICD-10-CM | POA: Diagnosis not present

## 2011-02-10 DIAGNOSIS — Z125 Encounter for screening for malignant neoplasm of prostate: Secondary | ICD-10-CM | POA: Diagnosis not present

## 2011-02-11 ENCOUNTER — Encounter: Payer: Self-pay | Admitting: Internal Medicine

## 2011-02-13 DIAGNOSIS — Z1212 Encounter for screening for malignant neoplasm of rectum: Secondary | ICD-10-CM | POA: Diagnosis not present

## 2011-02-25 ENCOUNTER — Telehealth: Payer: Self-pay | Admitting: *Deleted

## 2011-02-25 NOTE — Telephone Encounter (Signed)
Pt. Unaware of appt for Previsit or colon.  He was out of town and just got back.  He said he would call back to [facility] his previsit and colon after her checks his schedule.  Wyona Almas

## 2011-03-11 ENCOUNTER — Encounter: Payer: Medicare Other | Admitting: Internal Medicine

## 2011-07-04 ENCOUNTER — Encounter: Payer: Self-pay | Admitting: *Deleted

## 2011-08-06 ENCOUNTER — Encounter: Payer: Self-pay | Admitting: *Deleted

## 2011-10-31 ENCOUNTER — Encounter: Payer: Self-pay | Admitting: Internal Medicine

## 2012-02-09 DIAGNOSIS — Z79899 Other long term (current) drug therapy: Secondary | ICD-10-CM | POA: Diagnosis not present

## 2012-02-09 DIAGNOSIS — Z125 Encounter for screening for malignant neoplasm of prostate: Secondary | ICD-10-CM | POA: Diagnosis not present

## 2012-02-09 DIAGNOSIS — E785 Hyperlipidemia, unspecified: Secondary | ICD-10-CM | POA: Diagnosis not present

## 2012-02-09 DIAGNOSIS — B1710 Acute hepatitis C without hepatic coma: Secondary | ICD-10-CM | POA: Diagnosis not present

## 2012-02-16 DIAGNOSIS — Z0000 Encounter for general adult medical examination without abnormal findings: Secondary | ICD-10-CM | POA: Diagnosis not present

## 2012-02-16 DIAGNOSIS — F411 Generalized anxiety disorder: Secondary | ICD-10-CM | POA: Diagnosis not present

## 2012-02-16 DIAGNOSIS — Z87891 Personal history of nicotine dependence: Secondary | ICD-10-CM | POA: Diagnosis not present

## 2012-02-16 DIAGNOSIS — Z125 Encounter for screening for malignant neoplasm of prostate: Secondary | ICD-10-CM | POA: Diagnosis not present

## 2012-02-16 DIAGNOSIS — B1710 Acute hepatitis C without hepatic coma: Secondary | ICD-10-CM | POA: Diagnosis not present

## 2012-02-16 DIAGNOSIS — M7520 Bicipital tendinitis, unspecified shoulder: Secondary | ICD-10-CM | POA: Diagnosis not present

## 2012-02-16 DIAGNOSIS — E785 Hyperlipidemia, unspecified: Secondary | ICD-10-CM | POA: Diagnosis not present

## 2012-02-18 DIAGNOSIS — Z1212 Encounter for screening for malignant neoplasm of rectum: Secondary | ICD-10-CM | POA: Diagnosis not present

## 2013-02-11 DIAGNOSIS — F411 Generalized anxiety disorder: Secondary | ICD-10-CM | POA: Diagnosis not present

## 2013-02-11 DIAGNOSIS — E785 Hyperlipidemia, unspecified: Secondary | ICD-10-CM | POA: Diagnosis not present

## 2013-02-11 DIAGNOSIS — Z125 Encounter for screening for malignant neoplasm of prostate: Secondary | ICD-10-CM | POA: Diagnosis not present

## 2013-02-17 ENCOUNTER — Encounter: Payer: Self-pay | Admitting: Internal Medicine

## 2013-02-17 DIAGNOSIS — Z79899 Other long term (current) drug therapy: Secondary | ICD-10-CM | POA: Diagnosis not present

## 2013-03-10 ENCOUNTER — Ambulatory Visit (AMBULATORY_SURGERY_CENTER): Payer: Self-pay

## 2013-03-10 VITALS — Ht 72.0 in | Wt 215.0 lb

## 2013-03-10 DIAGNOSIS — Z860100 Personal history of colon polyps, unspecified: Secondary | ICD-10-CM

## 2013-03-10 MED ORDER — MOVIPREP 100 G PO SOLR: 1.0000 | Freq: Once | ORAL | Status: DC

## 2013-03-24 ENCOUNTER — Ambulatory Visit (AMBULATORY_SURGERY_CENTER): Payer: Medicare Other | Admitting: Internal Medicine

## 2013-03-24 ENCOUNTER — Encounter: Payer: Self-pay | Admitting: Internal Medicine

## 2013-03-24 VITALS — BP 167/88 | HR 70 | Temp 98.5°F | Resp 18 | Ht 72.0 in | Wt 215.0 lb

## 2013-03-24 DIAGNOSIS — Z86010 Personal history of colonic polyps: Secondary | ICD-10-CM | POA: Diagnosis not present

## 2013-03-24 DIAGNOSIS — D126 Benign neoplasm of colon, unspecified: Secondary | ICD-10-CM

## 2013-03-24 DIAGNOSIS — F411 Generalized anxiety disorder: Secondary | ICD-10-CM | POA: Diagnosis not present

## 2013-03-24 DIAGNOSIS — Z1211 Encounter for screening for malignant neoplasm of colon: Secondary | ICD-10-CM | POA: Diagnosis not present

## 2013-03-24 MED ORDER — FLEET ENEMA 7-19 GM/118ML RE ENEM
1.0000 | ENEMA | Freq: Once | RECTAL | Status: AC
  Administered 2013-03-24: 1 via RECTAL

## 2013-03-24 MED ORDER — SODIUM CHLORIDE 0.9 % IV SOLN: 500.0000 mL | INTRAVENOUS | Status: DC

## 2013-03-24 NOTE — Progress Notes (Signed)
Report to pacu rn, vss, bbs=clear 

## 2013-03-24 NOTE — Progress Notes (Signed)
Ernestine Conrad RN relayed pt drank 1st bottle of movi prep at 10:00 am wed am and 2nd bottle of movi prep at 2000 pm wed.  Last result was a clear, tan colored liquid.  Per Dr. Henrene Pastor enema till clear. maw

## 2013-03-24 NOTE — Op Note (Signed)
[place]  [address]. [place], [id number]   COLONOSCOPY PROCEDURE REPORT  PATIENT: [name], [name]  MR#: [id number] BIRTHDATE: 22-Aug-1955 , 57  yrs. old GENDER: Male ENDOSCOPIST: Eustace Quail, MD REFERRED NO:[id number] Program Recall PROCEDURE DATE:  03/24/2013 PROCEDURE:   Colonoscopy with snare polypectomy x2 First Screening Colonoscopy - Avg.  risk and is 50 yrs.  old or older - No.  Prior Negative Screening - Now for repeat screening. N/A  History of Adenoma - Now for follow-up colonoscopy & has been > or = to 3 yrs.  Yes hx of adenoma.  Has been 3 or more years since last colonoscopy.  Polyps Removed Today? Yes. ASA CLASS:   Class II INDICATIONS:Patient's personal history of adenomatous colon polyps. Index examination October 2007 with small adenoma. MEDICATIONS: MAC sedation, administered by CRNA and propofol (Diprivan) 330mg  IV DESCRIPTION OF PROCEDURE:   After the risks benefits and alternatives of the procedure were thoroughly explained, informed consent was obtained.  A digital rectal exam revealed no abnormalities of the rectum.   The LB [id number]  endoscope was introduced through the anus and advanced to the cecum, which was identified by both the appendix and ileocecal valve. No adverse events experienced.   The quality of the prep was good, using MoviPrep  The instrument was then slowly withdrawn as the colon was fully examined.  COLON FINDINGS: Two diminutive polyps were found in the descending colon.  A polypectomy was performed with a cold snare.  The resection was complete and the polyp tissue was completely retrieved.   Moderate diverticulosis was noted The finding was in the right colon and The finding was left colon.   The colon mucosa was otherwise normal.  Retroflexed views revealed internal hemorrhoids. The time to cecum=1 minutes 50 seconds.  Withdrawal time=12 minutes 05 seconds.  The scope was withdrawn and  the procedure completed. COMPLICATIONS: There were no complications.  ENDOSCOPIC IMPRESSION: 1.   Two diminutive polyps were found in the descending colon; polypectomy was performed with a cold snare 2.   Moderate diverticulosis was noted in the right colon and left colon 3.   The colon mucosa was otherwise normal  RECOMMENDATIONS: 1. Follow up colonoscopy in 5 years   eSigned:  Eustace Quail, MD 03/24/2013 12:08 PM   cc: Janalyn Rouse, MD and The Patient

## 2013-03-24 NOTE — Progress Notes (Signed)
Patient did not have preoperative order for IV antibiotic SSI prophylaxis. (G8918)  Patient did not experience any of the following events: a burn prior to discharge; a fall within the facility; wrong site/side/patient/procedure/implant event; or a hospital transfer or hospital admission upon discharge from the facility. (G8907)  

## 2013-03-24 NOTE — Progress Notes (Signed)
Looked at stool post Enema, and it was clear at this time.  Ernestine Conrad, RN

## 2013-03-24 NOTE — Patient Instructions (Signed)

## 2013-03-24 NOTE — Progress Notes (Signed)
Called to room to assist during endoscopic procedure.  Patient ID and intended procedure confirmed with present staff. Received instructions for my participation in the procedure from the performing physician.  

## 2013-03-24 NOTE — Progress Notes (Signed)
Pt blood pressure is elevated on admission.  167/102 lt arm and 170/101 rt arm.  Mont Dutton, CRNA was advised of this on admission.  He said "we can work with it". maw

## 2013-03-25 ENCOUNTER — Telehealth: Payer: Self-pay | Admitting: *Deleted

## 2013-03-25 NOTE — Telephone Encounter (Signed)
No answer. Name identifier. Message left to call if questions or concerns. 

## 2013-03-25 NOTE — Telephone Encounter (Deleted)
  Follow up Call-  Call back number 03/24/2013  Post procedure Call Back phone  # [phone] cell  Permission to leave phone message Yes     Patient questions:  Do you have a fever, pain , or abdominal swelling? {yes no:314532} Pain Score  {NUMBERS; 0-10:5044} *  Have you tolerated food without any problems? {yes no:314532}  Have you been able to return to your normal activities? {yes no:314532}  Do you have any questions about your discharge instructions: Diet   {yes no:314532} Medications  {yes no:314532} Follow up visit  {yes no:314532}  Do you have questions or concerns about your Care? {yes no:314532}  Actions: * If pain score is 4 or above: {ACTION; [facility] ENDO PAIN >4:21563::"No action needed, pain <4."}

## 2013-03-29 ENCOUNTER — Encounter: Payer: Self-pay | Admitting: Internal Medicine

## 2013-06-26 ENCOUNTER — Inpatient Hospital Stay (HOSPITAL_COMMUNITY): Payer: Medicare Other

## 2013-06-26 ENCOUNTER — Emergency Department (HOSPITAL_COMMUNITY): Payer: Medicare Other

## 2013-06-26 ENCOUNTER — Encounter (HOSPITAL_COMMUNITY): Payer: Self-pay | Admitting: *Deleted

## 2013-06-26 ENCOUNTER — Inpatient Hospital Stay (HOSPITAL_COMMUNITY)
Admission: EM | Admit: 2013-06-26 | Discharge: 2013-06-28 | DRG: 069 | Disposition: A | Discharge status: Home / Self Care | Payer: Medicare Other | Attending: Neurology | Admitting: Neurology

## 2013-06-26 DIAGNOSIS — I6350 Cerebral infarction due to unspecified occlusion or stenosis of unspecified cerebral artery: Secondary | ICD-10-CM | POA: Diagnosis not present

## 2013-06-26 DIAGNOSIS — R5381 Other malaise: Secondary | ICD-10-CM | POA: Diagnosis not present

## 2013-06-26 DIAGNOSIS — F1210 Cannabis abuse, uncomplicated: Secondary | ICD-10-CM | POA: Diagnosis present

## 2013-06-26 DIAGNOSIS — Z96649 Presence of unspecified artificial hip joint: Secondary | ICD-10-CM

## 2013-06-26 DIAGNOSIS — R5383 Other fatigue: Secondary | ICD-10-CM | POA: Diagnosis not present

## 2013-06-26 DIAGNOSIS — I658 Occlusion and stenosis of other precerebral arteries: Secondary | ICD-10-CM | POA: Diagnosis not present

## 2013-06-26 DIAGNOSIS — I2510 Atherosclerotic heart disease of native coronary artery without angina pectoris: Secondary | ICD-10-CM | POA: Diagnosis not present

## 2013-06-26 DIAGNOSIS — R079 Chest pain, unspecified: Secondary | ICD-10-CM | POA: Diagnosis not present

## 2013-06-26 DIAGNOSIS — R29818 Other symptoms and signs involving the nervous system: Secondary | ICD-10-CM | POA: Diagnosis not present

## 2013-06-26 DIAGNOSIS — G8190 Hemiplegia, unspecified affecting unspecified side: Secondary | ICD-10-CM | POA: Diagnosis present

## 2013-06-26 DIAGNOSIS — E785 Hyperlipidemia, unspecified: Secondary | ICD-10-CM | POA: Diagnosis present

## 2013-06-26 DIAGNOSIS — I6529 Occlusion and stenosis of unspecified carotid artery: Secondary | ICD-10-CM | POA: Diagnosis not present

## 2013-06-26 DIAGNOSIS — S68118A Complete traumatic metacarpophalangeal amputation of other finger, initial encounter: Secondary | ICD-10-CM

## 2013-06-26 DIAGNOSIS — R0602 Shortness of breath: Secondary | ICD-10-CM | POA: Diagnosis not present

## 2013-06-26 DIAGNOSIS — I639 Cerebral infarction, unspecified: Secondary | ICD-10-CM | POA: Diagnosis present

## 2013-06-26 DIAGNOSIS — Z981 Arthrodesis status: Secondary | ICD-10-CM

## 2013-06-26 DIAGNOSIS — F411 Generalized anxiety disorder: Secondary | ICD-10-CM | POA: Diagnosis present

## 2013-06-26 DIAGNOSIS — G459 Transient cerebral ischemic attack, unspecified: Principal | ICD-10-CM | POA: Diagnosis present

## 2013-06-26 DIAGNOSIS — F17200 Nicotine dependence, unspecified, uncomplicated: Secondary | ICD-10-CM | POA: Diagnosis present

## 2013-06-26 LAB — CBC
HEMATOCRIT: 41.8 % (ref 39.0–52.0)
Hemoglobin: 14.7 g/dL (ref 13.0–17.0)
MCH: 33.4 pg (ref 26.0–34.0)
MCHC: 35.2 g/dL (ref 30.0–36.0)
MCV: 95 fL (ref 78.0–100.0)
Platelets: 134 10*3/uL — ABNORMAL LOW (ref 150–400)
RBC: 4.4 MIL/uL (ref 4.22–5.81)
RDW: 12.9 % (ref 11.5–15.5)
WBC: 3.6 10*3/uL — ABNORMAL LOW (ref 4.0–10.5)

## 2013-06-26 LAB — I-STAT CHEM 8, ED
BUN: 12 mg/dL (ref 6–23)
Calcium, Ion: 1.03 mmol/L — ABNORMAL LOW (ref 1.12–1.23)
Chloride: 106 mEq/L (ref 96–112)
Creatinine, Ser: 0.7 mg/dL (ref 0.50–1.35)
Glucose, Bld: 90 mg/dL (ref 70–99)
HCT: 47 % (ref 39.0–52.0)
HEMOGLOBIN: 16 g/dL (ref 13.0–17.0)
Potassium: 4.1 mEq/L (ref 3.7–5.3)
SODIUM: 140 meq/L (ref 137–147)
TCO2: 21 mmol/L (ref 0–100)

## 2013-06-26 LAB — RAPID URINE DRUG SCREEN, HOSP PERFORMED
Amphetamines: NOT DETECTED
BARBITURATES: NOT DETECTED
Benzodiazepines: NOT DETECTED
COCAINE: NOT DETECTED
OPIATES: NOT DETECTED
TETRAHYDROCANNABINOL: POSITIVE — AB

## 2013-06-26 LAB — URINALYSIS, ROUTINE W REFLEX MICROSCOPIC
Bilirubin Urine: NEGATIVE
Glucose, UA: NEGATIVE mg/dL
HGB URINE DIPSTICK: NEGATIVE
KETONES UR: NEGATIVE mg/dL
Leukocytes, UA: NEGATIVE
NITRITE: NEGATIVE
Protein, ur: NEGATIVE mg/dL
Specific Gravity, Urine: 1.043 — ABNORMAL HIGH (ref 1.005–1.030)
Urobilinogen, UA: 0.2 mg/dL (ref 0.0–1.0)
pH: 6 (ref 5.0–8.0)

## 2013-06-26 LAB — COMPREHENSIVE METABOLIC PANEL
ALBUMIN: 3.5 g/dL (ref 3.5–5.2)
ALT: 29 U/L (ref 0–53)
AST: 33 U/L (ref 0–37)
Alkaline Phosphatase: 76 U/L (ref 39–117)
BUN: 13 mg/dL (ref 6–23)
CALCIUM: 9 mg/dL (ref 8.4–10.5)
CO2: 21 mEq/L (ref 19–32)
CREATININE: 0.69 mg/dL (ref 0.50–1.35)
Chloride: 100 mEq/L (ref 96–112)
GFR calc Af Amer: >90 mL/min (ref >90)
GFR calc non Af Amer: >90 mL/min (ref >90)
Glucose, Bld: 88 mg/dL (ref 70–99)
Potassium: 4.3 mEq/L (ref 3.7–5.3)
Sodium: 138 mEq/L (ref 137–147)
Total Bilirubin: 0.2 mg/dL — ABNORMAL LOW (ref 0.3–1.2)
Total Protein: 7.9 g/dL (ref 6.0–8.3)

## 2013-06-26 LAB — DIFFERENTIAL
BASOS ABS: 0 10*3/uL (ref 0.0–0.1)
BASOS PCT: 1 % (ref 0–1)
EOS PCT: 2 % (ref 0–5)
Eosinophils Absolute: 0.1 10*3/uL (ref 0.0–0.7)
Lymphocytes Relative: 54 % — ABNORMAL HIGH (ref 12–46)
Lymphs Abs: 2 10*3/uL (ref 0.7–4.0)
MONO ABS: 0.3 10*3/uL (ref 0.1–1.0)
Monocytes Relative: 9 % (ref 3–12)
Neutro Abs: 1.2 10*3/uL — ABNORMAL LOW (ref 1.7–7.7)
Neutrophils Relative %: 34 % — ABNORMAL LOW (ref 43–77)

## 2013-06-26 LAB — I-STAT TROPONIN, ED: Troponin i, poc: 0 ng/mL (ref 0.00–0.08)

## 2013-06-26 LAB — ETHANOL

## 2013-06-26 LAB — APTT: APTT: 30 s (ref 24–37)

## 2013-06-26 LAB — MRSA PCR SCREENING: MRSA by PCR: NEGATIVE

## 2013-06-26 LAB — PROTIME-INR
INR: 0.96 (ref 0.00–1.49)
Prothrombin Time: 12.6 seconds (ref 11.6–15.2)

## 2013-06-26 LAB — CBG MONITORING, ED: Glucose-Capillary: 78 mg/dL (ref 70–99)

## 2013-06-26 MED ORDER — IOHEXOL 350 MG/ML SOLN
100.0000 mL | Freq: Once | INTRAVENOUS | Status: AC | PRN
  Administered 2013-06-26: 100 mL via INTRAVENOUS

## 2013-06-26 MED ORDER — SENNOSIDES-DOCUSATE SODIUM 8.6-50 MG PO TABS
1.0000 | ORAL_TABLET | Freq: Every evening | ORAL | Status: DC | PRN
  Filled 2013-06-26: qty 1

## 2013-06-26 MED ORDER — PANTOPRAZOLE SODIUM 40 MG IV SOLR
40.0000 mg | Freq: Every day | INTRAVENOUS | Status: DC
  Administered 2013-06-26: 40 mg via INTRAVENOUS
  Filled 2013-06-26 (×2): qty 40

## 2013-06-26 MED ORDER — ALTEPLASE (STROKE) FULL DOSE INFUSION: 0.9000 mg/kg | Freq: Once | INTRAVENOUS | Status: DC

## 2013-06-26 MED ORDER — ACETAMINOPHEN 650 MG RE SUPP: 650.0000 mg | RECTAL | Status: DC | PRN

## 2013-06-26 MED ORDER — ACETAMINOPHEN 325 MG PO TABS
650.0000 mg | ORAL_TABLET | ORAL | Status: DC | PRN
  Administered 2013-06-26 – 2013-06-27 (×2): 650 mg via ORAL
  Filled 2013-06-26 (×2): qty 2

## 2013-06-26 MED ORDER — LABETALOL HCL 5 MG/ML IV SOLN: 10.0000 mg | INTRAVENOUS | Status: DC | PRN

## 2013-06-26 MED ORDER — SODIUM CHLORIDE 0.9 % IV SOLN
INTRAVENOUS | Status: DC
  Administered 2013-06-26 – 2013-06-27 (×2): via INTRAVENOUS

## 2013-06-26 MED ORDER — ALTEPLASE (STROKE) FULL DOSE INFUSION
0.9000 mg/kg | Freq: Once | INTRAVENOUS | Status: DC
  Administered 2013-06-26: 90 mg via INTRAVENOUS
  Filled 2013-06-26: qty 90

## 2013-06-26 MED ORDER — SODIUM CHLORIDE 0.9 % IV SOLN
250.0000 mL | Freq: Once | INTRAVENOUS | Status: AC
  Administered 2013-06-26: 250 mL via INTRAVENOUS
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
## 2013-06-26 NOTE — ED Notes (Signed)
CBG 78. 

## 2013-06-26 NOTE — ED Notes (Signed)
Pt from church, suddend onset of chestpain, left sided numbness and weakness. Pt received 324 asa and 2 nitro with some relief pta

## 2013-06-27 ENCOUNTER — Inpatient Hospital Stay (HOSPITAL_COMMUNITY): Payer: Medicare Other

## 2013-06-27 DIAGNOSIS — R209 Unspecified disturbances of skin sensation: Secondary | ICD-10-CM | POA: Diagnosis not present

## 2013-06-27 DIAGNOSIS — I6350 Cerebral infarction due to unspecified occlusion or stenosis of unspecified cerebral artery: Secondary | ICD-10-CM | POA: Diagnosis not present

## 2013-06-27 DIAGNOSIS — I369 Nonrheumatic tricuspid valve disorder, unspecified: Secondary | ICD-10-CM

## 2013-06-27 LAB — LIPID PANEL
CHOL/HDL RATIO: 3 ratio
Cholesterol: 202 mg/dL — ABNORMAL HIGH (ref 0–200)
HDL: 68 mg/dL (ref >39)
LDL CALC: 118 mg/dL — AB (ref 0–99)
TRIGLYCERIDES: 78 mg/dL (ref <150)
VLDL: 16 mg/dL (ref 0–40)

## 2013-06-27 LAB — HEMOGLOBIN A1C
Hgb A1c MFr Bld: 5.9 % — ABNORMAL HIGH (ref <5.7)
Mean Plasma Glucose: 123 mg/dL — ABNORMAL HIGH (ref <117)

## 2013-06-27 MED ORDER — ATORVASTATIN CALCIUM 20 MG PO TABS
20.0000 mg | ORAL_TABLET | Freq: Every day | ORAL | Status: DC
  Administered 2013-06-27 – 2013-06-28 (×2): 20 mg via ORAL
  Filled 2013-06-27 (×2): qty 1

## 2013-06-27 MED ORDER — PANTOPRAZOLE SODIUM 40 MG PO TBEC
40.0000 mg | DELAYED_RELEASE_TABLET | Freq: Every day | ORAL | Status: DC
  Administered 2013-06-27 – 2013-06-28 (×2): 40 mg via ORAL
  Filled 2013-06-27 (×2): qty 1

## 2013-06-27 MED ORDER — ASPIRIN EC 325 MG PO TBEC
325.0000 mg | DELAYED_RELEASE_TABLET | Freq: Every day | ORAL | Status: DC
  Administered 2013-06-27 – 2013-06-28 (×2): 325 mg via ORAL
  Filled 2013-06-27 (×2): qty 1
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
## 2013-06-27 NOTE — Progress Notes (Signed)
Occupational Therapy Evaluation Patient Details Name: [name] MRN: [id number] DOB: 18-Jun-1955 Today's Date: 06/27/2013    History of Present Illness patient is a 58 yo male who presented with left sided weakness, dysarthria, visual changes and chest pain. Patient recieved TPA upon arrival and stroke work-up is underway. MRI pending.   Clinical Impression   PTA, pt independent with ADL and mobility. Pt presents at baseline level with ADL and mobility. Pt self reports that visual changes have resolved, in addition to UE weakness/numbness. Educated pt on signs/symptoms of CVA. Used teach back for education.     Follow Up Recommendations  No OT follow up;Supervision - Intermittent    Equipment Recommendations  None recommended by OT    Recommendations for Other Services       Precautions / Restrictions Precautions Precautions: None      Mobility Bed Mobility Overal bed mobility: Independent                Transfers Overall transfer level: Independent                    Balance Overall balance assessment: No apparent balance deficits (not formally assessed)                               Standardized Balance Assessment Standardized Balance Assessment : Dynamic Gait Index   Dynamic Gait Index Change in Gait Speed:  (no significant change in velocity)      ADL Overall ADL's : At baseline                                             Vision Eye Alignment: Within Functional Limits Alignment/Gaze Preference: Within Defined Limits Ocular Range of Motion: Within Functional Limits Tracking/Visual Pursuits: Able to track stimulus in all quads without difficulty Saccades: Within functional limits Convergence: Within functional limits         Perception     Praxis Praxis Praxis tested?: Within functional limits    Pertinent Vitals/Pain No c/o pain VSS     Hand Dominance Right   Extremity/Trunk Assessment  Upper Extremity Assessment Upper Extremity Assessment: Overall WFL for tasks assessed   Lower Extremity Assessment Lower Extremity Assessment: Overall WFL for tasks assessed   Cervical / Trunk Assessment Cervical / Trunk Assessment: Normal   Communication Communication Communication: No difficulties   Cognition Arousal/Alertness: Awake/alert Behavior During Therapy: WFL for tasks assessed/performed Overall Cognitive Status: Within Functional Limits for tasks assessed                     General Comments       Exercises       Shoulder Instructions      Home Living Family/patient expects to be discharged to:: Private residence Living Arrangements: Spouse/significant other Available Help at Discharge: Friend(s) Type of Home: House Home Access: Stairs to enter [employer] of Steps: 7 Entrance Stairs-Rails: Can reach both Home Layout: One level     Bathroom Shower/Tub: English as a second language teacher characteristics: Architectural technologist: Standard Bathroom Accessibility: Yes How Accessible: Accessible via walker Home Equipment: [place] - 2 wheels;Cane - single point;Bedside commode   Additional Comments: walk in shower with AE shower seat, elevated toilets      Prior Functioning/Environment Level of Independence: Independent  OT Diagnosis:     OT Problem List:     OT Treatment/Interventions:      OT Goals(Current goals can be found in the care plan section)    OT Frequency:  eval only   Barriers to D/C:  none          Co-evaluation              End of Session Nurse Communication: Mobility status  Activity Tolerance: Patient tolerated treatment well Patient left: in chair;with call bell/phone within reach   Time: 4782-9562 OT Time Calculation (min): 15 min Charges:  OT General Charges $OT Visit: 1 Procedure OT Evaluation $Initial OT Evaluation Tier I: 1 Procedure OT Treatments $Self Care/Home Management : 8-22  mins G-Codes:    Roney Jaffe Teryn Gust 2013-07-11, 1:26 PM   [facility], OTR/L  [phone] 07-11-2013

## 2013-06-27 NOTE — Evaluation (Signed)
Physical Therapy Evaluation Patient Details Name: [name] MRN: [id number] DOB: [birth year] Today's Date: 06/27/2013   History of Present Illness  patient is a 58 yo male who presented with left sided weakness and chest pain. Patient recieved TPA upon arrival and stroke work-up is underway. MRI pending.  Clinical Impression  Patient ambulating independently, reports resolution of symptoms. Doing well. No further acute PT needs. Will sign off.     Follow Up Recommendations No PT follow up;Supervision - Intermittent    Equipment Recommendations  None recommended by PT    Recommendations for Other Services       Precautions / Restrictions Precautions Precautions: None      Mobility  Bed Mobility Overal bed mobility: Independent                Transfers Overall transfer level: Independent                  Ambulation/Gait Ambulation/Gait assistance: Independent Ambulation Distance (Feet): 440 Feet Assistive device: None Gait Pattern/deviations: Antalgic (baseline deviation) Gait velocity: modestly decreased   General Gait Details: steady with ambulation  Stairs Stairs: Yes Stairs assistance: Modified independent (Device/Increase time) Stair Management: One rail Right;Alternating pattern Number of Stairs: 7 General stair comments: no difficulty  Wheelchair Mobility    Modified Rankin (Stroke Patients Only) Modified Rankin (Stroke Patients Only) Pre-Morbid Rankin Score: No symptoms Modified Rankin: Slight disability     Balance Overall balance assessment: No apparent balance deficits (not formally assessed)                               Standardized Balance Assessment Standardized Balance Assessment : Dynamic Gait Index   Dynamic Gait Index Level Surface: Mild Impairment Change in Gait Speed: Mild Impairment (no significant change in velocity) Gait with Horizontal Head Turns: Normal Gait with Vertical Head Turns:  Normal Gait and Pivot Turn: Normal Step Over Obstacle: Normal Step Around Obstacles: Normal Steps: Mild Impairment Total Score: 21       Pertinent Vitals/Pain No pain, VSS    Home Living Family/patient expects to be discharged to:: Private residence Living Arrangements: Spouse/significant other Available Help at Discharge: Friend(s) Type of Home: House Home Access: Stairs to enter Entrance Stairs-Rails: Can reach both Entrance Stairs-Number of Steps: 7 Home Layout: One level Home Equipment: Environmental consultant - 2 wheels;Cane - single point;Bedside commode Additional Comments: walk in shower with AE shower seat, elevated toilets    Prior Function Level of Independence: Independent               Hand Dominance   Dominant Hand: Right    Extremity/Trunk Assessment               Lower Extremity Assessment: Overall WFL for tasks assessed (hx of bilateral hip replacements, baseline anatalgic gait)         Communication      Cognition Arousal/Alertness: Awake/alert Behavior During Therapy: WFL for tasks assessed/performed Overall Cognitive Status: Within Functional Limits for tasks assessed                      General Comments General comments (skin integrity, edema, etc.): spoke with patient regarding things to be on the look out for in the future and iportance of seeking quick medical attention. Patient very appreciative of information.     Exercises        Assessment/Plan    PT Assessment Patent does  not need any further PT services  PT Diagnosis     PT Problem List    PT Treatment Interventions     PT Goals (Current goals can be found in the Care Plan section) Acute Rehab PT Goals PT Goal Formulation: No goals set, d/c therapy    Frequency     Barriers to discharge        Co-evaluation               End of Session Equipment Utilized During Treatment: Gait belt Activity Tolerance: Patient tolerated treatment well Patient left: in  chair;with call bell/phone within reach Nurse Communication: Mobility status         Time: 0921-0946 PT Time Calculation (min): 25 min   Charges:   PT Evaluation $Initial PT Evaluation Tier I: 1 Procedure PT Treatments $Gait Training: 8-22 mins $Self Care/Home Management: 8-22   PT G Codes:          Duncan Dull 06/27/2013, 10:17 AM Alben Deeds, PT DPT  [phone]

## 2013-06-27 NOTE — Progress Notes (Signed)
PT Cancellation Note  Patient Details Name: [name] MRN: [id number] DOB: 1955-08-13   Cancelled Treatment:    Reason Eval/Treat Not Completed: Patient not medically ready (bedrest orders listed, will evaluate once activity updated)   Duncan Dull 06/27/2013, 7:51 AM Alben Deeds, PT DPT  [phone]

## 2013-06-27 NOTE — Progress Notes (Signed)
[facility]  2D Echocardiogram completed.  [place], [place] 06/27/2013 2:50 PM

## 2013-06-27 NOTE — Progress Notes (Signed)
*  PRELIMINARY RESULTS* Vascular Ultrasound Carotid Duplex (Doppler) has been completed.  Preliminary findings: Bilateral:  1-39% ICA stenosis.  Vertebral artery flow is antegrade.    Landry Mellow, RDMS, RVT  06/27/2013, 3:50 PM

## 2013-06-28 DIAGNOSIS — I6350 Cerebral infarction due to unspecified occlusion or stenosis of unspecified cerebral artery: Secondary | ICD-10-CM | POA: Diagnosis not present

## 2013-06-28 MED ORDER — ASPIRIN 325 MG PO TBEC: 325.0000 mg | DELAYED_RELEASE_TABLET | Freq: Every day | ORAL | Status: DC

## 2013-06-28 MED ORDER — ATORVASTATIN CALCIUM 20 MG PO TABS: 20.0000 mg | ORAL_TABLET | Freq: Every day | ORAL | Status: DC
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
## 2013-06-28 NOTE — Progress Notes (Signed)
UR complete.  Nikelle Malatesta RN, MSN 

## 2013-06-28 NOTE — Progress Notes (Signed)
SLP Cancellation Note  Patient Details Name: [name] MRN: [id number] DOB: 1955-05-31   Cancelled treatment:       Reason Eval/Treat Not Completed: SLP screened, no needs identified, will sign off. Pt believes he has returned to baseline and there is no evidence of acute CVA on MRI. Please re-order as needed.    Germain Osgood, M.A. CCC-SLP [phone]  Germain Osgood 06/28/2013, 2:12 PM

## 2013-06-28 NOTE — Progress Notes (Signed)
Pt discharcged home to self care. AVS  handouts  Discharge instruction and follow up visit given pt have agreed to participate in the [facility] program forms and concerts signed . Stroke education re enforced and pt verbalized good understanding.  condition at discharge is stable . Pt awaiting girlfriend for pick up. Carin Hock RN

## 2013-07-26 DIAGNOSIS — I6350 Cerebral infarction due to unspecified occlusion or stenosis of unspecified cerebral artery: Secondary | ICD-10-CM | POA: Diagnosis not present

## 2013-07-26 DIAGNOSIS — R55 Syncope and collapse: Secondary | ICD-10-CM | POA: Diagnosis not present

## 2013-07-26 DIAGNOSIS — R42 Dizziness and giddiness: Secondary | ICD-10-CM | POA: Diagnosis not present

## 2013-07-26 DIAGNOSIS — I10 Essential (primary) hypertension: Secondary | ICD-10-CM | POA: Diagnosis not present

## 2013-07-26 DIAGNOSIS — Z6831 Body mass index (BMI) 31.0-31.9, adult: Secondary | ICD-10-CM | POA: Diagnosis not present

## 2013-07-27 DIAGNOSIS — E785 Hyperlipidemia, unspecified: Secondary | ICD-10-CM | POA: Diagnosis not present

## 2013-07-27 DIAGNOSIS — I10 Essential (primary) hypertension: Secondary | ICD-10-CM | POA: Diagnosis not present

## 2013-07-27 DIAGNOSIS — F17200 Nicotine dependence, unspecified, uncomplicated: Secondary | ICD-10-CM | POA: Diagnosis not present

## 2013-07-27 DIAGNOSIS — I6350 Cerebral infarction due to unspecified occlusion or stenosis of unspecified cerebral artery: Secondary | ICD-10-CM | POA: Diagnosis not present

## 2013-08-08 DIAGNOSIS — Z6831 Body mass index (BMI) 31.0-31.9, adult: Secondary | ICD-10-CM | POA: Diagnosis not present

## 2013-08-08 DIAGNOSIS — I10 Essential (primary) hypertension: Secondary | ICD-10-CM | POA: Diagnosis not present

## 2013-10-21 DIAGNOSIS — E785 Hyperlipidemia, unspecified: Secondary | ICD-10-CM | POA: Diagnosis not present

## 2013-10-24 DIAGNOSIS — Z8679 Personal history of other diseases of the circulatory system: Secondary | ICD-10-CM | POA: Diagnosis not present

## 2013-10-24 DIAGNOSIS — F17200 Nicotine dependence, unspecified, uncomplicated: Secondary | ICD-10-CM | POA: Diagnosis not present

## 2013-10-24 DIAGNOSIS — I10 Essential (primary) hypertension: Secondary | ICD-10-CM | POA: Diagnosis not present

## 2013-10-24 DIAGNOSIS — B1710 Acute hepatitis C without hepatic coma: Secondary | ICD-10-CM | POA: Diagnosis not present

## 2013-10-24 DIAGNOSIS — R059 Cough, unspecified: Secondary | ICD-10-CM | POA: Diagnosis not present

## 2013-10-24 DIAGNOSIS — E785 Hyperlipidemia, unspecified: Secondary | ICD-10-CM | POA: Diagnosis not present

## 2013-10-24 DIAGNOSIS — E669 Obesity, unspecified: Secondary | ICD-10-CM | POA: Diagnosis not present

## 2013-10-24 DIAGNOSIS — R05 Cough: Secondary | ICD-10-CM | POA: Diagnosis not present

## 2013-10-24 DIAGNOSIS — Z1331 Encounter for screening for depression: Secondary | ICD-10-CM | POA: Diagnosis not present

## 2013-11-08 ENCOUNTER — Other Ambulatory Visit: Payer: Medicare Other

## 2013-11-08 DIAGNOSIS — B182 Chronic viral hepatitis C: Secondary | ICD-10-CM

## 2013-11-08 LAB — COMPREHENSIVE METABOLIC PANEL
ALT: 132 U/L — ABNORMAL HIGH (ref 0–53)
AST: 111 U/L — AB (ref 0–37)
Albumin: 3.7 g/dL (ref 3.5–5.2)
Alkaline Phosphatase: 87 U/L (ref 39–117)
BUN: 12 mg/dL (ref 6–23)
CO2: 29 mEq/L (ref 19–32)
Calcium: 9.1 mg/dL (ref 8.4–10.5)
Chloride: 103 mEq/L (ref 96–112)
Creat: 0.84 mg/dL (ref 0.50–1.35)
Glucose, Bld: 93 mg/dL (ref 70–99)
Potassium: 4.5 mEq/L (ref 3.5–5.3)
SODIUM: 139 meq/L (ref 135–145)
Total Bilirubin: 0.6 mg/dL (ref 0.2–1.2)
Total Protein: 7.4 g/dL (ref 6.0–8.3)

## 2013-11-08 LAB — CBC WITH DIFFERENTIAL/PLATELET
BASOS PCT: 0 % (ref 0–1)
Basophils Absolute: 0 10*3/uL (ref 0.0–0.1)
EOS ABS: 0.1 10*3/uL (ref 0.0–0.7)
Eosinophils Relative: 3 % (ref 0–5)
HCT: 40.9 % (ref 39.0–52.0)
Hemoglobin: 13.9 g/dL (ref 13.0–17.0)
Lymphocytes Relative: 43 % (ref 12–46)
Lymphs Abs: 1 10*3/uL (ref 0.7–4.0)
MCH: 32.3 pg (ref 26.0–34.0)
MCHC: 34 g/dL (ref 30.0–36.0)
MCV: 95.1 fL (ref 78.0–100.0)
Monocytes Absolute: 0.4 10*3/uL (ref 0.1–1.0)
Monocytes Relative: 18 % — ABNORMAL HIGH (ref 3–12)
NEUTROS PCT: 36 % — AB (ref 43–77)
Neutro Abs: 0.9 10*3/uL — ABNORMAL LOW (ref 1.7–7.7)
PLATELETS: 104 10*3/uL — AB (ref 150–400)
RBC: 4.3 MIL/uL (ref 4.22–5.81)
RDW: 13.9 % (ref 11.5–15.5)
WBC: 2.4 10*3/uL — ABNORMAL LOW (ref 4.0–10.5)

## 2013-11-08 LAB — PROTIME-INR
INR: 0.95 (ref <1.50)
Prothrombin Time: 12.7 seconds (ref 11.6–15.2)

## 2013-11-08 LAB — HEPATITIS B CORE ANTIBODY, TOTAL: Hep B Core Total Ab: NONREACTIVE

## 2013-11-08 LAB — HEPATITIS B SURFACE ANTIGEN: HEP B S AG: NEGATIVE

## 2013-11-08 LAB — HEPATITIS A ANTIBODY, TOTAL: HEP A TOTAL AB: REACTIVE — AB

## 2013-11-08 LAB — HIV ANTIBODY (ROUTINE TESTING W REFLEX): HIV: NONREACTIVE

## 2013-11-08 LAB — HEPATITIS B SURFACE ANTIBODY,QUALITATIVE: HEP B S AB: NEGATIVE

## 2013-11-08 LAB — IRON: IRON: 156 ug/dL (ref 42–165)

## 2013-11-09 LAB — ANTI-NUCLEAR AB-TITER (ANA TITER): ANA Titer 1: NEGATIVE

## 2013-11-09 LAB — HEPATITIS C RNA QUANTITATIVE
HCV Quantitative Log: 6.21 {Log} — ABNORMAL HIGH (ref <1.18)
HCV Quantitative: 1623905 IU/mL — ABNORMAL HIGH (ref <15)

## 2013-11-09 LAB — ANA: Anti Nuclear Antibody(ANA): POSITIVE — AB

## 2013-11-15 LAB — HEPATITIS C GENOTYPE

## 2013-12-08 ENCOUNTER — Ambulatory Visit (INDEPENDENT_AMBULATORY_CARE_PROVIDER_SITE_OTHER): Payer: Medicare Other | Admitting: Internal Medicine

## 2013-12-08 ENCOUNTER — Encounter: Payer: Self-pay | Admitting: Internal Medicine

## 2013-12-08 VITALS — BP 134/80 | HR 80 | Temp 98.0°F | Ht 72.0 in | Wt 224.0 lb

## 2013-12-08 DIAGNOSIS — I639 Cerebral infarction, unspecified: Secondary | ICD-10-CM | POA: Diagnosis not present

## 2013-12-08 DIAGNOSIS — B182 Chronic viral hepatitis C: Secondary | ICD-10-CM | POA: Diagnosis not present

## 2013-12-08 DIAGNOSIS — Z23 Encounter for immunization: Secondary | ICD-10-CM

## 2013-12-08 NOTE — Patient Instructions (Signed)
Date 12/08/13  Dear [name], As discussed in the [facility], your hepatitis C therapy will include the following medications:          Harvoni 90mg /400mg  tablet:           Take 1 tablet by mouth once daily   Please note that ALL MEDICATIONS WILL START ON THE SAME DATE for a total of 12 weeks. ---------------------------------------------------------------- Your HCV Treatment Start Date: TBA   Your HCV genotype:  1b    Liver Fibrosis: TBD    ---------------------------------------------------------------- YOUR PHARMACY CONTACT:   [place] [facility] and [place] Phone: [phone] Hours: Monday to Friday 7:30 am to 6:00 pm   Please always contact your pharmacy at least 3-4 business days before you run out of medications to ensure your next month's medication is ready or 1 week prior to running out if you receive it by mail.  Remember, each prescription is for 28 days. ---------------------------------------------------------------- GENERAL NOTES REGARDING YOUR HEPATITIS C MEDICATION:  SOFOSBUVIR/LEDIPASVIR (HARVONI): - Harvoni tablet is taken daily with OR without food. - The tablets are orange. - The tablets should be stored at room temperature.  - Acid reducing agents such as H2 blockers (ie. Pepcid (famotidine), Zantac (ranitidine), Tagamet (cimetidine), Axid (nizatidine) and proton pump inhibitors (ie. Prilosec (omeprazole), Protonix (pantoprazole), Nexium (esomeprazole), or Aciphex (rabeprazole)) can decrease effectiveness of Harvoni. Do not take until you have discussed with a health care provider.    -Antacids that contain magnesium and/or aluminum hydroxide (ie. Milk of Magensia, Rolaids, Gaviscon, Maalox, Mylanta, an dArthritis Pain Formula)can reduce absorption of Harvoni, so take them at least 4 hours before or after Harvoni.  -Calcium carbonate (calcium supplements or antacids such as Tums, Caltrate,  Os-Cal)needs to be taken at least 4 hours hours before or after Harvoni.  -St. John's wort or any products that contain St. John's wort like some herbal supplements  Please inform the office prior to starting any of these medications.  - The common side effects with Harvoni:      1. Fatigue      2. Headache      3. Nausea      4. Diarrhea      5. Insomnia   Support Path is a suite of resources designed to help patients start with HARVONI and move toward treatment completion [place] helps patients access therapy and get off to an efficient start  Benefits investigation and prior authorization support Co-pay and other financial assistance A specialty pharmacy finder CO-PAY COUPON The [place] co-pay coupon may help eligible patients lower their out-of-pocket costs. With a co-pay coupon, most eligible patients may pay no more than $5 per co-pay (restrictions apply) www.harvoni.com call 640-323-5629 Not valid for patients enrolled in government healthcare prescription drug programs, such as Medicare Part D and Medicaid. Patients in the coverage gap known as the "donut hole" also are not eligible The HARVONI co-pay coupon program will cover the out-of-pocket costs for HARVONI prescriptions up to a maximum of 25% of the catalog price of a 12-week regimen of HARVONI  Please note that this only lists the most common side effects and is NOT a comprehensive list of the potential side effects of these medications. For more information, please review the drug information sheets that come with your medication package from the pharmacy.  ---------------------------------------------------------------- GENERAL HELPFUL HINTS ON HCV THERAPY: 1. No alcohol. 2. Protect against sun-sensitivity/sunburns (wear sunglasses, hat, long sleeves, pants  and sunscreen). 3. Stay well-hydrated/well-moisturized. 4. Notify the [facility] of any changes in your other over-the-counter/herbal or  prescription medications. 5. If you miss a dose of your medication, take the missed dose as soon as you remember. Return to your regular time/dose schedule the next day.  6.  Do not stop taking your medications without first talking with your healthcare provider. 7.  You may take Tylenol (acetaminophen), as long as the dose is less than 2000 mg (OR no more than 4 tablets of the Tylenol Extra Strengths 500mg  tablet) in 24 hours. 8.  You will need to obtain routine labs and/or office visits at [facility] at weeks 2, 4, 8,  and 12 as well as 12 and 24 weeks after completion of treatment.   Scharlene Gloss, [place] for [place], [place]  [phone]

## 2013-12-08 NOTE — Addendum Note (Signed)
Addended by: Myrtis Hopping A on: 12/08/2013 10:09 AM   Modules accepted: Orders
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
## 2014-01-10 ENCOUNTER — Other Ambulatory Visit: Payer: Self-pay | Admitting: Internal Medicine

## 2014-01-10 ENCOUNTER — Ambulatory Visit (HOSPITAL_COMMUNITY)
Admission: RE | Admit: 2014-01-10 | Discharge: 2014-01-10 | Disposition: A | Discharge status: Home / Self Care | Payer: Medicare Other | Source: Ambulatory Visit | Attending: Internal Medicine | Admitting: Internal Medicine

## 2014-01-10 DIAGNOSIS — B182 Chronic viral hepatitis C: Secondary | ICD-10-CM | POA: Diagnosis not present

## 2014-01-10 MED ORDER — LEDIPASVIR-SOFOSBUVIR 90-400 MG PO TABS: 1.0000 | ORAL_TABLET | Freq: Every day | ORAL | Status: DC

## 2014-01-13 ENCOUNTER — Other Ambulatory Visit: Payer: Self-pay | Admitting: Internal Medicine

## 2014-01-13 DIAGNOSIS — B182 Chronic viral hepatitis C: Secondary | ICD-10-CM

## 2014-01-16 ENCOUNTER — Encounter: Payer: Self-pay | Admitting: Internal Medicine

## 2014-01-16 ENCOUNTER — Ambulatory Visit (INDEPENDENT_AMBULATORY_CARE_PROVIDER_SITE_OTHER): Payer: Medicare Other | Admitting: Internal Medicine

## 2014-01-16 VITALS — BP 144/75 | HR 82 | Temp 98.2°F | Ht 72.0 in | Wt 220.0 lb

## 2014-01-16 DIAGNOSIS — I639 Cerebral infarction, unspecified: Secondary | ICD-10-CM | POA: Diagnosis not present

## 2014-01-16 DIAGNOSIS — Z23 Encounter for immunization: Secondary | ICD-10-CM | POA: Diagnosis not present

## 2014-01-16 DIAGNOSIS — B182 Chronic viral hepatitis C: Secondary | ICD-10-CM

## 2014-01-16 NOTE — Addendum Note (Signed)
Addended by: Landis Gandy on: 01/16/2014 04:38 PM   Modules accepted: Orders

## 2014-01-16 NOTE — Assessment & Plan Note (Addendum)
He has been approved for [facility] and will go pick it up now.  He was counseled by myself on compliance, labs, follow up.  Also drug interactions.   I have asked him to hold the Crestor while he is on Harvoni and I will notify his PCP of that if Dr. Brigitte Pulse feels he needs an alternative statin such as atorvastatin in the meantime.   25 minutes spent with patient including 15 minutes face to face counseling on medication and process as well as elastogrpahy results.
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
## 2014-01-16 NOTE — Progress Notes (Signed)
   Subjective:    Patient ID: [name], male    DOB: November 18, 1955, 58 y.o.   MRN: [id number]  HPI Here for follow up for hepatitis C.  Genotype 1b, viral load 1,623,905, hep A immune.  Elastography with F2/3.  Just approved for Harvoni.    Review of Systems     Objective:   Physical Exam        Assessment & Plan:

## 2014-01-17 ENCOUNTER — Other Ambulatory Visit: Payer: Self-pay | Admitting: *Deleted

## 2014-01-17 DIAGNOSIS — B182 Chronic viral hepatitis C: Secondary | ICD-10-CM

## 2014-02-16 ENCOUNTER — Other Ambulatory Visit: Payer: Medicare Other

## 2014-02-16 DIAGNOSIS — B182 Chronic viral hepatitis C: Secondary | ICD-10-CM

## 2014-02-16 LAB — COMPLETE METABOLIC PANEL WITH GFR
ALBUMIN: 3.5 g/dL (ref 3.5–5.2)
ALK PHOS: 61 U/L (ref 39–117)
ALT: 9 U/L (ref 0–53)
AST: 13 U/L (ref 0–37)
BUN: 14 mg/dL (ref 6–23)
CO2: 28 mEq/L (ref 19–32)
Calcium: 9.4 mg/dL (ref 8.4–10.5)
Chloride: 102 mEq/L (ref 96–112)
Creat: 0.83 mg/dL (ref 0.50–1.35)
GFR, Est African American: >89 mL/min
GLUCOSE: 81 mg/dL (ref 70–99)
Potassium: 4.1 mEq/L (ref 3.5–5.3)
Sodium: 138 mEq/L (ref 135–145)
TOTAL PROTEIN: 7.1 g/dL (ref 6.0–8.3)
Total Bilirubin: 0.4 mg/dL (ref 0.2–1.2)

## 2014-02-16 LAB — CBC
HEMATOCRIT: 43.9 % (ref 39.0–52.0)
Hemoglobin: 15.2 g/dL (ref 13.0–17.0)
MCH: 32.1 pg (ref 26.0–34.0)
MCHC: 34.6 g/dL (ref 30.0–36.0)
MCV: 92.8 fL (ref 78.0–100.0)
MPV: 10.1 fL (ref 8.6–12.4)
Platelets: 181 10*3/uL (ref 150–400)
RBC: 4.73 MIL/uL (ref 4.22–5.81)
RDW: 12.7 % (ref 11.5–15.5)
WBC: 4.2 10*3/uL (ref 4.0–10.5)

## 2014-02-17 LAB — HEPATITIS C RNA QUANTITATIVE: HCV QUANT: NOT DETECTED [IU]/mL (ref <15)

## 2014-02-27 ENCOUNTER — Ambulatory Visit (INDEPENDENT_AMBULATORY_CARE_PROVIDER_SITE_OTHER): Payer: Medicare Other | Admitting: Internal Medicine

## 2014-02-27 ENCOUNTER — Encounter: Payer: Self-pay | Admitting: Internal Medicine

## 2014-02-27 VITALS — BP 120/70 | HR 70 | Temp 97.3°F | Wt 218.0 lb

## 2014-02-27 DIAGNOSIS — B182 Chronic viral hepatitis C: Secondary | ICD-10-CM

## 2014-02-27 NOTE — Progress Notes (Signed)
   Subjective:    Patient ID: [name], male    DOB: 19-Nov-1955, 59 y.o.   MRN: [id number]  HPI Here for follow up for hepatitis C.  Genotype 1b, viral load 1,623,905, hep A immune.  Elastography with F2/3.  Started Harvoni about one month ago, now on a second bottle.  No side effects, no complaints. His hepatitis C virus is now undetectable after the first month of treatment. He is pleased with this.  Also, his transaminases have normalized to the normal range. He feels well. He is holding his Crestor.   Review of Systems  Gastrointestinal: Negative for diarrhea, constipation and abdominal distention.  Skin: Negative for rash.  Neurological: Negative for dizziness and headaches.       Objective:   Physical Exam  Constitutional: He appears well-developed and well-nourished. No distress.  Eyes: No scleral icterus.  Cardiovascular: Normal rate, regular rhythm and normal heart sounds.   No murmur heard. Skin: No rash noted.          Assessment & Plan:

## 2014-02-27 NOTE — Assessment & Plan Note (Signed)
He is doing well with the Harvoni. He will complete the 12 weeks and I will check his viral load after he completes it. He knows to call if he has any issues prior to that. He knows to avoid acid reflux medicine while on treatment. 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

## 2014-03-08 DIAGNOSIS — E785 Hyperlipidemia, unspecified: Secondary | ICD-10-CM | POA: Diagnosis not present

## 2014-03-08 DIAGNOSIS — Z0000 Encounter for general adult medical examination without abnormal findings: Secondary | ICD-10-CM | POA: Diagnosis not present

## 2014-03-08 DIAGNOSIS — Z125 Encounter for screening for malignant neoplasm of prostate: Secondary | ICD-10-CM | POA: Diagnosis not present

## 2014-03-08 DIAGNOSIS — I10 Essential (primary) hypertension: Secondary | ICD-10-CM | POA: Diagnosis not present

## 2014-03-15 ENCOUNTER — Other Ambulatory Visit: Payer: Self-pay | Admitting: Internal Medicine

## 2014-03-15 DIAGNOSIS — Z0289 Encounter for other administrative examinations: Secondary | ICD-10-CM

## 2014-03-22 ENCOUNTER — Ambulatory Visit
Admission: RE | Admit: 2014-03-22 | Discharge: 2014-03-22 | Disposition: A | Discharge status: Home / Self Care | Payer: Medicare Other | Source: Ambulatory Visit | Attending: Internal Medicine | Admitting: Internal Medicine

## 2014-03-22 DIAGNOSIS — Z0289 Encounter for other administrative examinations: Secondary | ICD-10-CM

## 2014-03-22 DIAGNOSIS — Z122 Encounter for screening for malignant neoplasm of respiratory organs: Secondary | ICD-10-CM | POA: Diagnosis not present

## 2014-03-22 DIAGNOSIS — Z87891 Personal history of nicotine dependence: Secondary | ICD-10-CM | POA: Diagnosis not present

## 2014-04-24 ENCOUNTER — Other Ambulatory Visit: Payer: Medicare Other

## 2014-04-24 DIAGNOSIS — B182 Chronic viral hepatitis C: Secondary | ICD-10-CM | POA: Diagnosis not present

## 2014-04-24 LAB — COMPREHENSIVE METABOLIC PANEL
ALT: 12 U/L (ref 0–53)
AST: 15 U/L (ref 0–37)
Albumin: 4.1 g/dL (ref 3.5–5.2)
Alkaline Phosphatase: 70 U/L (ref 39–117)
BILIRUBIN TOTAL: 0.5 mg/dL (ref 0.2–1.2)
BUN: 13 mg/dL (ref 6–23)
CHLORIDE: 99 meq/L (ref 96–112)
CO2: 26 mEq/L (ref 19–32)
Calcium: 9.5 mg/dL (ref 8.4–10.5)
Creat: 0.76 mg/dL (ref 0.50–1.35)
Glucose, Bld: 115 mg/dL — ABNORMAL HIGH (ref 70–99)
Potassium: 4.7 mEq/L (ref 3.5–5.3)
SODIUM: 137 meq/L (ref 135–145)
Total Protein: 7.5 g/dL (ref 6.0–8.3)

## 2014-04-26 LAB — HEPATITIS C RNA QUANTITATIVE: HCV QUANT: NOT DETECTED [IU]/mL (ref <15)

## 2014-05-08 ENCOUNTER — Ambulatory Visit (INDEPENDENT_AMBULATORY_CARE_PROVIDER_SITE_OTHER): Payer: Medicare Other | Admitting: Internal Medicine

## 2014-05-08 ENCOUNTER — Encounter: Payer: Self-pay | Admitting: Internal Medicine

## 2014-05-08 VITALS — BP 130/81 | HR 79 | Temp 98.0°F | Ht 72.0 in | Wt 221.0 lb

## 2014-05-08 DIAGNOSIS — B182 Chronic viral hepatitis C: Secondary | ICD-10-CM | POA: Diagnosis not present

## 2014-05-08 NOTE — Assessment & Plan Note (Signed)
He is doing well and now completed treatment and end of treatment viral load is undetectable. We will need to do one more viral load in 3 months to confirm cure. He will return after his lab in 3 months. I also will repeat his elastography in about a year. I informed him that he is able to restart his Crestor.  He also has been having problems with allergies and I suggested over-the-counter medicine such as Zyrtec and Benadryl.

## 2014-05-08 NOTE — Progress Notes (Signed)
   Subjective:    Patient ID: [name], male    DOB: 30-Sep-1955, 59 y.o.   MRN: [id number]  HPI Here for follow up for hepatitis C.  Genotype 1b, viral load 1,623,905, hep A immune.  Elastography with F2/3.  Now completed Harvoni.   His hepatitis C virus has remained undetectable after treatment completion. Also, his transaminases have normalized to the normal range. He feels well.    Review of Systems  Gastrointestinal: Negative for diarrhea, constipation and abdominal distention.  Skin: Negative for rash.  Neurological: Negative for dizziness and headaches.       Objective:   Physical Exam  Constitutional: He appears well-developed and well-nourished. No distress.  Eyes: No scleral icterus.  Cardiovascular: Normal rate, regular rhythm and normal heart sounds.   No murmur heard. Skin: No rash noted.          Assessment & Plan:

## 2014-06-08 ENCOUNTER — Ambulatory Visit: Payer: Medicare Other

## 2014-07-25 ENCOUNTER — Other Ambulatory Visit: Payer: Medicare Other

## 2014-07-25 DIAGNOSIS — B182 Chronic viral hepatitis C: Secondary | ICD-10-CM

## 2014-07-25 LAB — COMPLETE METABOLIC PANEL WITH GFR
ALBUMIN: 4.1 g/dL (ref 3.5–5.2)
ALK PHOS: 78 U/L (ref 39–117)
ALT: 15 U/L (ref 0–53)
AST: 17 U/L (ref 0–37)
BUN: 16 mg/dL (ref 6–23)
CALCIUM: 9.4 mg/dL (ref 8.4–10.5)
CO2: 27 mEq/L (ref 19–32)
Chloride: 105 mEq/L (ref 96–112)
Creat: 0.78 mg/dL (ref 0.50–1.35)
GFR, Est African American: >89 mL/min
GFR, Est Non African American: >89 mL/min
Glucose, Bld: 125 mg/dL — ABNORMAL HIGH (ref 70–99)
POTASSIUM: 4.1 meq/L (ref 3.5–5.3)
SODIUM: 143 meq/L (ref 135–145)
Total Bilirubin: 0.3 mg/dL (ref 0.2–1.2)
Total Protein: 7 g/dL (ref 6.0–8.3)

## 2014-07-26 LAB — HEPATITIS C RNA QUANTITATIVE: HCV QUANT: NOT DETECTED [IU]/mL (ref <15)

## 2014-07-31 ENCOUNTER — Other Ambulatory Visit: Payer: Medicare Other

## 2014-08-08 ENCOUNTER — Encounter: Payer: Self-pay | Admitting: Internal Medicine

## 2014-08-08 ENCOUNTER — Ambulatory Visit (INDEPENDENT_AMBULATORY_CARE_PROVIDER_SITE_OTHER): Payer: Medicare Other | Admitting: Internal Medicine

## 2014-08-08 VITALS — BP 126/75 | HR 81 | Temp 98.5°F | Wt 230.0 lb

## 2014-08-08 DIAGNOSIS — B182 Chronic viral hepatitis C: Secondary | ICD-10-CM | POA: Diagnosis present

## 2014-08-08 NOTE — Assessment & Plan Note (Signed)
Doing well and now considered cured.  RTC in 1 year to recheck elastography.

## 2014-08-08 NOTE — Progress Notes (Signed)
   Subjective:    Patient ID: [name], male    DOB: September 29, 1955, 59 y.o.   MRN: [id number]  HPI Here for follow up for hepatitis C.  Genotype 1b, viral load 1,623,905, hep A immune.  Elastography with F2/3.  Now completed Harvoni.   His hepatitis C virus has remained undetectable after treatment completion and now at [facility]. Also, his transaminases have normalized to the normal range. He feels well.    Review of Systems  Gastrointestinal: Negative for diarrhea, constipation and abdominal distention.  Skin: Negative for rash.  Neurological: Negative for dizziness and headaches.       Objective:   Physical Exam  Constitutional: He appears well-developed and well-nourished. No distress.  Eyes: No scleral icterus.  Cardiovascular: Normal rate, regular rhythm and normal heart sounds.   No murmur heard. Skin: No rash noted.          Assessment & Plan:

## 2014-12-17 ENCOUNTER — Emergency Department (HOSPITAL_COMMUNITY)
Admission: EM | Admit: 2014-12-17 | Discharge: 2014-12-17 | Disposition: A | Discharge status: Home / Self Care | Payer: No Typology Code available for payment source | Attending: Physician Assistant | Admitting: Physician Assistant

## 2014-12-17 ENCOUNTER — Emergency Department (HOSPITAL_COMMUNITY): Payer: No Typology Code available for payment source

## 2014-12-17 ENCOUNTER — Encounter (HOSPITAL_COMMUNITY): Payer: Self-pay | Admitting: Emergency Medicine

## 2014-12-17 DIAGNOSIS — Z880 Allergy status to penicillin: Secondary | ICD-10-CM | POA: Diagnosis not present

## 2014-12-17 DIAGNOSIS — M542 Cervicalgia: Secondary | ICD-10-CM | POA: Diagnosis not present

## 2014-12-17 DIAGNOSIS — Y9389 Activity, other specified: Secondary | ICD-10-CM | POA: Insufficient documentation

## 2014-12-17 DIAGNOSIS — Z87442 Personal history of urinary calculi: Secondary | ICD-10-CM | POA: Diagnosis not present

## 2014-12-17 DIAGNOSIS — Z79899 Other long term (current) drug therapy: Secondary | ICD-10-CM | POA: Insufficient documentation

## 2014-12-17 DIAGNOSIS — S060X0A Concussion without loss of consciousness, initial encounter: Secondary | ICD-10-CM | POA: Insufficient documentation

## 2014-12-17 DIAGNOSIS — S199XXA Unspecified injury of neck, initial encounter: Secondary | ICD-10-CM | POA: Diagnosis not present

## 2014-12-17 DIAGNOSIS — Y92410 Unspecified street and highway as the place of occurrence of the external cause: Secondary | ICD-10-CM | POA: Diagnosis not present

## 2014-12-17 DIAGNOSIS — F419 Anxiety disorder, unspecified: Secondary | ICD-10-CM | POA: Insufficient documentation

## 2014-12-17 DIAGNOSIS — Z7982 Long term (current) use of aspirin: Secondary | ICD-10-CM | POA: Diagnosis not present

## 2014-12-17 DIAGNOSIS — S0990XA Unspecified injury of head, initial encounter: Secondary | ICD-10-CM | POA: Diagnosis present

## 2014-12-17 DIAGNOSIS — R51 Headache: Secondary | ICD-10-CM | POA: Diagnosis not present

## 2014-12-17 DIAGNOSIS — Y998 Other external cause status: Secondary | ICD-10-CM | POA: Diagnosis not present

## 2014-12-17 DIAGNOSIS — Z87891 Personal history of nicotine dependence: Secondary | ICD-10-CM | POA: Diagnosis not present

## 2014-12-17 MED ORDER — IBUPROFEN 800 MG PO TABS
800.0000 mg | ORAL_TABLET | Freq: Once | ORAL | Status: AC
  Administered 2014-12-17: 800 mg via ORAL
  Filled 2014-12-17: qty 1

## 2014-12-17 MED ORDER — IBUPROFEN 800 MG PO TABS: 800.0000 mg | ORAL_TABLET | Freq: Three times a day (TID) | ORAL | Status: DC

## 2014-12-17 MED ORDER — CYCLOBENZAPRINE HCL 10 MG PO TABS: 10.0000 mg | ORAL_TABLET | Freq: Two times a day (BID) | ORAL | Status: DC | PRN

## 2014-12-17 MED ORDER — ACETAMINOPHEN 325 MG PO TABS
650.0000 mg | ORAL_TABLET | Freq: Once | ORAL | Status: AC
  Administered 2014-12-17: 650 mg via ORAL
  Filled 2014-12-17: qty 2

## 2014-12-17 NOTE — ED Notes (Signed)
Pt. Ambulated to the bathroom, gait steady.  
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
## 2014-12-17 NOTE — Discharge Instructions (Signed)
Please return with any numbness tingling or concerning symptoms.   Concussion, Adult A concussion, or closed-head injury, is a brain injury caused by a direct blow to the head or by a quick and sudden movement (jolt) of the head or neck. Concussions are usually not life-threatening. Even so, the effects of a concussion can be serious. If you have had a concussion before, you are more likely to experience concussion-like symptoms after a direct blow to the head.  CAUSES  Direct blow to the head, such as from running into another player during a soccer game, being hit in a fight, or hitting your head on a hard surface.  A jolt of the head or neck that causes the brain to move back and forth inside the skull, such as in a car crash. SIGNS AND SYMPTOMS The signs of a concussion can be hard to notice. Early on, they may be missed by you, family members, and health care providers. You may look fine but act or feel differently. Symptoms are usually temporary, but they may last for days, weeks, or even longer. Some symptoms may appear right away while others may not show up for hours or days. Every head injury is different. Symptoms include:  Mild to moderate headaches that will not go away.  A feeling of pressure inside your head.  Having more trouble than usual:  Learning or remembering things you have heard.  Answering questions.  Paying attention or concentrating.  Organizing daily tasks.  Making decisions and solving problems.  Slowness in thinking, acting or reacting, speaking, or reading.  Getting lost or being easily confused.  Feeling tired all the time or lacking energy (fatigued).  Feeling drowsy.  Sleep disturbances.  Sleeping more than usual.  Sleeping less than usual.  Trouble falling asleep.  Trouble sleeping (insomnia).  Loss of balance or feeling lightheaded or dizzy.  Nausea or vomiting.  Numbness or tingling.  Increased sensitivity  to:  Sounds.  Lights.  Distractions.  Vision problems or eyes that tire easily.  Diminished sense of taste or smell.  Ringing in the ears.  Mood changes such as feeling sad or anxious.  Becoming easily irritated or angry for little or no reason.  Lack of motivation.  Seeing or hearing things other people do not see or hear (hallucinations). DIAGNOSIS Your health care provider can usually diagnose a concussion based on a description of your injury and symptoms. He or she will ask whether you passed out (lost consciousness) and whether you are having trouble remembering events that happened right before and during your injury. Your evaluation might include:  A brain scan to look for signs of injury to the brain. Even if the test shows no injury, you may still have a concussion.  Blood tests to be sure other problems are not present. TREATMENT  Concussions are usually treated in an emergency department, in urgent care, or at a clinic. You may need to stay in the hospital overnight for further treatment.  Tell your health care provider if you are taking any medicines, including prescription medicines, over-the-counter medicines, and natural remedies. Some medicines, such as blood thinners (anticoagulants) and aspirin, may increase the chance of complications. Also tell your health care provider whether you have had alcohol or are taking illegal drugs. This information may affect treatment.  Your health care provider will send you home with important instructions to follow.  How fast you will recover from a concussion depends on many factors. These factors include how  severe your concussion is, what part of your brain was injured, your age, and how healthy you were before the concussion.  Most people with mild injuries recover fully. Recovery can take time. In general, recovery is slower in older persons. Also, persons who have had a concussion in the past or have other medical  problems may find that it takes longer to recover from their current injury. HOME CARE INSTRUCTIONS General Instructions  Carefully follow the directions your health care provider gave you.  Only take over-the-counter or prescription medicines for pain, discomfort, or fever as directed by your health care provider.  Take only those medicines that your health care provider has approved.  Do not drink alcohol until your health care provider says you are well enough to do so. Alcohol and certain other drugs may slow your recovery and can put you at risk of further injury.  If it is harder than usual to remember things, write them down.  If you are easily distracted, try to do one thing at a time. For example, do not try to watch TV while fixing dinner.  Talk with family members or close friends when making important decisions.  Keep all follow-up appointments. Repeated evaluation of your symptoms is recommended for your recovery.  Watch your symptoms and tell others to do the same. Complications sometimes occur after a concussion. Older adults with a brain injury may have a higher risk of serious complications, such as a blood clot on the brain.  Tell your teachers, school nurse, school counselor, coach, athletic trainer, or work Freight forwarder about your injury, symptoms, and restrictions. Tell them about what you can or cannot do. They should watch for:  Increased problems with attention or concentration.  Increased difficulty remembering or learning new information.  Increased time needed to complete tasks or assignments.  Increased irritability or decreased ability to cope with stress.  Increased symptoms.  Rest. Rest helps the brain to heal. Make sure you:  Get plenty of sleep at night. Avoid staying up late at night.  Keep the same bedtime hours on weekends and weekdays.  Rest during the day. Take daytime naps or rest breaks when you feel tired.  Limit activities that require a  lot of thought or concentration. These include:  Doing homework or job-related work.  Watching TV.  Working on the computer.  Avoid any situation where there is potential for another head injury (football, hockey, soccer, basketball, martial arts, downhill snow sports and horseback riding). Your condition will get worse every time you experience a concussion. You should avoid these activities until you are evaluated by the appropriate follow-up health care providers. Returning To Your Regular Activities You will need to return to your normal activities slowly, not all at once. You must give your body and brain enough time for recovery.  Do not return to sports or other athletic activities until your health care provider tells you it is safe to do so.  Ask your health care provider when you can drive, ride a bicycle, or operate heavy machinery. Your ability to react may be slower after a brain injury. Never do these activities if you are dizzy.  Ask your health care provider about when you can return to work or school. Preventing Another Concussion It is very important to avoid another brain injury, especially before you have recovered. In rare cases, another injury can lead to permanent brain damage, brain swelling, or death. The risk of this is greatest during the first 7-10  days after a head injury. Avoid injuries by:  Wearing a seat belt when riding in a car.  Drinking alcohol only in moderation.  Wearing a helmet when biking, skiing, skateboarding, skating, or doing similar activities.  Avoiding activities that could lead to a second concussion, such as contact or recreational sports, until your health care provider says it is okay.  Taking safety measures in your home.  Remove clutter and tripping hazards from floors and stairways.  Use grab bars in bathrooms and handrails by stairs.  Place non-slip mats on floors and in bathtubs.  Improve lighting in dim areas. SEEK MEDICAL  CARE IF:  You have increased problems paying attention or concentrating.  You have increased difficulty remembering or learning new information.  You need more time to complete tasks or assignments than before.  You have increased irritability or decreased ability to cope with stress.  You have more symptoms than before. Seek medical care if you have any of the following symptoms for more than 2 weeks after your injury:  Lasting (chronic) headaches.  Dizziness or balance problems.  Nausea.  Vision problems.  Increased sensitivity to noise or light.  Depression or mood swings.  Anxiety or irritability.  Memory problems.  Difficulty concentrating or paying attention.  Sleep problems.  Feeling tired all the time. SEEK IMMEDIATE MEDICAL CARE IF:  You have severe or worsening headaches. These may be a sign of a blood clot in the brain.  You have weakness (even if only in one hand, leg, or part of the face).  You have numbness.  You have decreased coordination.  You vomit repeatedly.  You have increased sleepiness.  One pupil is larger than the other.  You have convulsions.  You have slurred speech.  You have increased confusion. This may be a sign of a blood clot in the brain.  You have increased restlessness, agitation, or irritability.  You are unable to recognize people or places.  You have neck pain.  It is difficult to wake you up.  You have unusual behavior changes.  You lose consciousness. MAKE SURE YOU:  Understand these instructions.  Will watch your condition.  Will get help right away if you are not doing well or get worse.   This information is not intended to replace advice given to you by your health care provider. Make sure you discuss any questions you have with your health care provider.   Document Released: 04/12/2003 Document Revised: 02/10/2014 Document Reviewed: 08/12/2012 Elsevier Interactive Patient Education [employer]

## 2015-03-13 DIAGNOSIS — E784 Other hyperlipidemia: Secondary | ICD-10-CM | POA: Diagnosis not present

## 2015-03-13 DIAGNOSIS — Z125 Encounter for screening for malignant neoplasm of prostate: Secondary | ICD-10-CM | POA: Diagnosis not present

## 2015-03-13 DIAGNOSIS — R7301 Impaired fasting glucose: Secondary | ICD-10-CM | POA: Diagnosis not present

## 2015-03-13 DIAGNOSIS — I10 Essential (primary) hypertension: Secondary | ICD-10-CM | POA: Diagnosis not present

## 2015-03-20 DIAGNOSIS — Z8673 Personal history of transient ischemic attack (TIA), and cerebral infarction without residual deficits: Secondary | ICD-10-CM | POA: Diagnosis not present

## 2015-03-20 DIAGNOSIS — R7301 Impaired fasting glucose: Secondary | ICD-10-CM | POA: Diagnosis not present

## 2015-03-20 DIAGNOSIS — B1920 Unspecified viral hepatitis C without hepatic coma: Secondary | ICD-10-CM | POA: Diagnosis not present

## 2015-03-20 DIAGNOSIS — F411 Generalized anxiety disorder: Secondary | ICD-10-CM | POA: Diagnosis not present

## 2015-03-20 DIAGNOSIS — Z6833 Body mass index (BMI) 33.0-33.9, adult: Secondary | ICD-10-CM | POA: Diagnosis not present

## 2015-03-20 DIAGNOSIS — Z1389 Encounter for screening for other disorder: Secondary | ICD-10-CM | POA: Diagnosis not present

## 2015-03-20 DIAGNOSIS — R740 Nonspecific elevation of levels of transaminase and lactic acid dehydrogenase [LDH]: Secondary | ICD-10-CM | POA: Diagnosis not present

## 2015-03-20 DIAGNOSIS — Z0000 Encounter for general adult medical examination without abnormal findings: Secondary | ICD-10-CM | POA: Diagnosis not present

## 2015-03-20 DIAGNOSIS — M542 Cervicalgia: Secondary | ICD-10-CM | POA: Diagnosis not present

## 2015-03-20 DIAGNOSIS — I2510 Atherosclerotic heart disease of native coronary artery without angina pectoris: Secondary | ICD-10-CM | POA: Diagnosis not present

## 2015-03-20 DIAGNOSIS — I10 Essential (primary) hypertension: Secondary | ICD-10-CM | POA: Diagnosis not present

## 2015-03-20 DIAGNOSIS — E784 Other hyperlipidemia: Secondary | ICD-10-CM | POA: Diagnosis not present

## 2015-03-27 ENCOUNTER — Other Ambulatory Visit: Payer: Self-pay | Admitting: Acute Care

## 2015-03-27 DIAGNOSIS — Z87891 Personal history of nicotine dependence: Secondary | ICD-10-CM

## 2015-04-03 ENCOUNTER — Ambulatory Visit
Admission: RE | Admit: 2015-04-03 | Discharge: 2015-04-03 | Disposition: A | Discharge status: Home / Self Care | Payer: Medicare Other | Source: Ambulatory Visit | Attending: Acute Care | Admitting: Acute Care

## 2015-04-03 ENCOUNTER — Other Ambulatory Visit: Payer: Self-pay | Admitting: Acute Care

## 2015-04-03 ENCOUNTER — Encounter: Payer: Self-pay | Admitting: Acute Care

## 2015-04-03 DIAGNOSIS — Z87891 Personal history of nicotine dependence: Secondary | ICD-10-CM | POA: Diagnosis not present

## 2015-04-04 ENCOUNTER — Other Ambulatory Visit: Payer: Self-pay | Admitting: Acute Care

## 2015-04-04 DIAGNOSIS — Z87891 Personal history of nicotine dependence: Secondary | ICD-10-CM

## 2015-05-02 DIAGNOSIS — Z6832 Body mass index (BMI) 32.0-32.9, adult: Secondary | ICD-10-CM | POA: Diagnosis not present

## 2015-05-02 DIAGNOSIS — I10 Essential (primary) hypertension: Secondary | ICD-10-CM | POA: Diagnosis not present

## 2015-05-02 DIAGNOSIS — G959 Disease of spinal cord, unspecified: Secondary | ICD-10-CM | POA: Diagnosis not present

## 2015-05-04 DIAGNOSIS — M4802 Spinal stenosis, cervical region: Secondary | ICD-10-CM | POA: Diagnosis not present

## 2015-05-04 DIAGNOSIS — G959 Disease of spinal cord, unspecified: Secondary | ICD-10-CM | POA: Diagnosis not present

## 2015-05-17 DIAGNOSIS — Z6832 Body mass index (BMI) 32.0-32.9, adult: Secondary | ICD-10-CM | POA: Diagnosis not present

## 2015-05-17 DIAGNOSIS — M50223 Other cervical disc displacement at C6-C7 level: Secondary | ICD-10-CM | POA: Diagnosis not present

## 2015-06-15 DIAGNOSIS — M50223 Other cervical disc displacement at C6-C7 level: Secondary | ICD-10-CM | POA: Diagnosis not present

## 2015-06-15 DIAGNOSIS — M50123 Cervical disc disorder at C6-C7 level with radiculopathy: Secondary | ICD-10-CM | POA: Diagnosis not present

## 2015-06-15 DIAGNOSIS — M4722 Other spondylosis with radiculopathy, cervical region: Secondary | ICD-10-CM | POA: Diagnosis not present

## 2015-07-05 DIAGNOSIS — M50223 Other cervical disc displacement at C6-C7 level: Secondary | ICD-10-CM | POA: Diagnosis not present

## 2015-07-05 DIAGNOSIS — Z6832 Body mass index (BMI) 32.0-32.9, adult: Secondary | ICD-10-CM | POA: Diagnosis not present

## 2015-09-05 DIAGNOSIS — M50223 Other cervical disc displacement at C6-C7 level: Secondary | ICD-10-CM | POA: Diagnosis not present

## 2015-09-05 DIAGNOSIS — Z6831 Body mass index (BMI) 31.0-31.9, adult: Secondary | ICD-10-CM | POA: Diagnosis not present

## 2015-09-27 DIAGNOSIS — M1711 Unilateral primary osteoarthritis, right knee: Secondary | ICD-10-CM | POA: Diagnosis not present

## 2015-10-26 DIAGNOSIS — Z23 Encounter for immunization: Secondary | ICD-10-CM | POA: Diagnosis not present

## 2016-03-05 ENCOUNTER — Encounter: Payer: Self-pay | Admitting: Gastroenterology

## 2016-03-05 ENCOUNTER — Ambulatory Visit (INDEPENDENT_AMBULATORY_CARE_PROVIDER_SITE_OTHER): Payer: Medicare Other | Admitting: Gastroenterology

## 2016-03-05 VITALS — BP 124/80 | HR 92 | Ht 72.0 in | Wt 235.0 lb

## 2016-03-05 DIAGNOSIS — K625 Hemorrhage of anus and rectum: Secondary | ICD-10-CM | POA: Insufficient documentation

## 2016-03-05 DIAGNOSIS — K648 Other hemorrhoids: Secondary | ICD-10-CM | POA: Insufficient documentation
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
## 2016-03-05 NOTE — Progress Notes (Signed)
Initial assessment and plans noted. Should the patient's bleeding persist, he will need reevaluated. thanks

## 2016-03-05 NOTE — Patient Instructions (Signed)
Restart Aspirin 81 mg daily.

## 2016-03-11 DIAGNOSIS — E784 Other hyperlipidemia: Secondary | ICD-10-CM | POA: Diagnosis not present

## 2016-03-11 DIAGNOSIS — R8299 Other abnormal findings in urine: Secondary | ICD-10-CM | POA: Diagnosis not present

## 2016-03-11 DIAGNOSIS — I10 Essential (primary) hypertension: Secondary | ICD-10-CM | POA: Diagnosis not present

## 2016-03-11 DIAGNOSIS — Z125 Encounter for screening for malignant neoplasm of prostate: Secondary | ICD-10-CM | POA: Diagnosis not present

## 2016-03-12 DIAGNOSIS — R7301 Impaired fasting glucose: Secondary | ICD-10-CM | POA: Diagnosis not present

## 2016-03-20 DIAGNOSIS — Z0000 Encounter for general adult medical examination without abnormal findings: Secondary | ICD-10-CM | POA: Diagnosis not present

## 2016-03-20 DIAGNOSIS — R0989 Other specified symptoms and signs involving the circulatory and respiratory systems: Secondary | ICD-10-CM | POA: Diagnosis not present

## 2016-03-20 DIAGNOSIS — E784 Other hyperlipidemia: Secondary | ICD-10-CM | POA: Diagnosis not present

## 2016-03-20 DIAGNOSIS — M25519 Pain in unspecified shoulder: Secondary | ICD-10-CM | POA: Diagnosis not present

## 2016-03-20 DIAGNOSIS — I10 Essential (primary) hypertension: Secondary | ICD-10-CM | POA: Diagnosis not present

## 2016-03-20 DIAGNOSIS — R7301 Impaired fasting glucose: Secondary | ICD-10-CM | POA: Diagnosis not present

## 2016-03-20 DIAGNOSIS — E668 Other obesity: Secondary | ICD-10-CM | POA: Diagnosis not present

## 2016-03-20 DIAGNOSIS — Z6832 Body mass index (BMI) 32.0-32.9, adult: Secondary | ICD-10-CM | POA: Diagnosis not present

## 2016-03-20 DIAGNOSIS — B1920 Unspecified viral hepatitis C without hepatic coma: Secondary | ICD-10-CM | POA: Diagnosis not present

## 2016-03-20 DIAGNOSIS — F411 Generalized anxiety disorder: Secondary | ICD-10-CM | POA: Diagnosis not present

## 2016-03-20 DIAGNOSIS — Z8673 Personal history of transient ischemic attack (TIA), and cerebral infarction without residual deficits: Secondary | ICD-10-CM | POA: Diagnosis not present

## 2016-03-20 DIAGNOSIS — I2510 Atherosclerotic heart disease of native coronary artery without angina pectoris: Secondary | ICD-10-CM | POA: Diagnosis not present

## 2016-03-24 ENCOUNTER — Other Ambulatory Visit: Payer: Self-pay | Admitting: Internal Medicine

## 2016-03-24 DIAGNOSIS — F17201 Nicotine dependence, unspecified, in remission: Secondary | ICD-10-CM

## 2016-03-26 ENCOUNTER — Inpatient Hospital Stay
Admission: RE | Admit: 2016-03-26 | Discharge: 2016-03-26 | Disposition: A | Discharge status: Home / Self Care | Payer: Medicare Other | Source: Ambulatory Visit | Attending: Internal Medicine | Admitting: Internal Medicine

## 2016-03-26 ENCOUNTER — Other Ambulatory Visit: Payer: Self-pay | Admitting: Internal Medicine

## 2016-03-26 DIAGNOSIS — R0989 Other specified symptoms and signs involving the circulatory and respiratory systems: Secondary | ICD-10-CM

## 2016-03-28 ENCOUNTER — Ambulatory Visit
Admission: RE | Admit: 2016-03-28 | Discharge: 2016-03-28 | Disposition: A | Discharge status: Home / Self Care | Payer: Medicare Other | Source: Ambulatory Visit | Attending: Internal Medicine | Admitting: Internal Medicine

## 2016-03-28 DIAGNOSIS — F17201 Nicotine dependence, unspecified, in remission: Secondary | ICD-10-CM

## 2016-03-28 DIAGNOSIS — I6523 Occlusion and stenosis of bilateral carotid arteries: Secondary | ICD-10-CM | POA: Diagnosis not present

## 2016-03-28 DIAGNOSIS — R0989 Other specified symptoms and signs involving the circulatory and respiratory systems: Secondary | ICD-10-CM

## 2016-03-28 DIAGNOSIS — Z87891 Personal history of nicotine dependence: Secondary | ICD-10-CM | POA: Diagnosis not present

## 2016-04-03 DIAGNOSIS — M25512 Pain in left shoulder: Secondary | ICD-10-CM | POA: Diagnosis not present

## 2016-04-11 DIAGNOSIS — M25512 Pain in left shoulder: Secondary | ICD-10-CM | POA: Diagnosis not present

## 2016-04-23 DIAGNOSIS — M75112 Incomplete rotator cuff tear or rupture of left shoulder, not specified as traumatic: Secondary | ICD-10-CM | POA: Diagnosis not present

## 2016-04-23 DIAGNOSIS — M19012 Primary osteoarthritis, left shoulder: Secondary | ICD-10-CM | POA: Diagnosis not present

## 2016-05-22 DIAGNOSIS — M19012 Primary osteoarthritis, left shoulder: Secondary | ICD-10-CM | POA: Diagnosis not present

## 2016-05-22 DIAGNOSIS — M7542 Impingement syndrome of left shoulder: Secondary | ICD-10-CM | POA: Diagnosis not present

## 2016-06-22 ENCOUNTER — Encounter (HOSPITAL_COMMUNITY): Payer: Self-pay | Admitting: Emergency Medicine

## 2016-06-22 ENCOUNTER — Emergency Department (HOSPITAL_COMMUNITY)
Admission: EM | Admit: 2016-06-22 | Discharge: 2016-06-22 | Disposition: A | Discharge status: Home / Self Care | Payer: Medicare HMO | Attending: Emergency Medicine | Admitting: Emergency Medicine

## 2016-06-22 ENCOUNTER — Emergency Department (HOSPITAL_COMMUNITY): Payer: Medicare HMO

## 2016-06-22 DIAGNOSIS — Z96643 Presence of artificial hip joint, bilateral: Secondary | ICD-10-CM | POA: Diagnosis not present

## 2016-06-22 DIAGNOSIS — L03032 Cellulitis of left toe: Secondary | ICD-10-CM | POA: Insufficient documentation

## 2016-06-22 DIAGNOSIS — L600 Ingrowing nail: Secondary | ICD-10-CM | POA: Diagnosis not present

## 2016-06-22 DIAGNOSIS — Z87891 Personal history of nicotine dependence: Secondary | ICD-10-CM | POA: Insufficient documentation

## 2016-06-22 DIAGNOSIS — Z7982 Long term (current) use of aspirin: Secondary | ICD-10-CM | POA: Diagnosis not present

## 2016-06-22 DIAGNOSIS — M7989 Other specified soft tissue disorders: Secondary | ICD-10-CM | POA: Diagnosis not present

## 2016-06-22 DIAGNOSIS — M79672 Pain in left foot: Secondary | ICD-10-CM | POA: Diagnosis present

## 2016-06-22 MED ORDER — MUPIROCIN CALCIUM 2 % EX CREA: 1.0000 "application " | TOPICAL_CREAM | Freq: Two times a day (BID) | CUTANEOUS | 0 refills | Status: AC

## 2016-06-22 MED ORDER — LIDOCAINE HCL 2 % IJ SOLN
10.0000 mL | Freq: Once | INTRAMUSCULAR | Status: AC
  Administered 2016-06-22: 200 mg via INTRADERMAL
  Filled 2016-06-22: qty 20

## 2016-06-22 MED ORDER — MUPIROCIN CALCIUM 2 % EX CREA: 1.0000 "application " | TOPICAL_CREAM | Freq: Two times a day (BID) | CUTANEOUS | 0 refills | Status: DC

## 2016-06-22 MED ORDER — CLINDAMYCIN HCL 300 MG PO CAPS: 300.0000 mg | ORAL_CAPSULE | Freq: Four times a day (QID) | ORAL | 0 refills | Status: DC

## 2016-06-22 MED ORDER — CLINDAMYCIN HCL 300 MG PO CAPS: 300.0000 mg | ORAL_CAPSULE | Freq: Four times a day (QID) | ORAL | 0 refills | Status: AC

## 2016-06-22 NOTE — Discharge Instructions (Signed)
Continue to soak your foot/toe in warm sitz baths at least 2-3 times daily Take antibiotics as prescribed Keep the area clean and dry Follow-up this week with a foot doctor

## 2016-06-22 NOTE — ED Notes (Signed)
Left great toe wound dressed.

## 2016-06-22 NOTE — ED Triage Notes (Addendum)
Pt c/o pain to left great toe x 2 days. Pt reports yesterday he noticed yellow drainage coming from left big toe nail area. Pt denies injury. Pt presents with redness and swelling to left toe that is spreading to other toes on left foot.

## 2016-06-22 NOTE — ED Notes (Signed)
Pt stable, understands discharge instructions, and reasons for return.   
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
## 2016-07-17 DIAGNOSIS — M1711 Unilateral primary osteoarthritis, right knee: Secondary | ICD-10-CM | POA: Diagnosis not present

## 2017-03-20 DIAGNOSIS — Z125 Encounter for screening for malignant neoplasm of prostate: Secondary | ICD-10-CM | POA: Diagnosis not present

## 2017-03-20 DIAGNOSIS — I10 Essential (primary) hypertension: Secondary | ICD-10-CM | POA: Diagnosis not present

## 2017-03-20 DIAGNOSIS — E7849 Other hyperlipidemia: Secondary | ICD-10-CM | POA: Diagnosis not present

## 2017-03-20 DIAGNOSIS — R82998 Other abnormal findings in urine: Secondary | ICD-10-CM | POA: Diagnosis not present

## 2017-03-20 DIAGNOSIS — R7301 Impaired fasting glucose: Secondary | ICD-10-CM | POA: Diagnosis not present

## 2017-03-27 DIAGNOSIS — I6523 Occlusion and stenosis of bilateral carotid arteries: Secondary | ICD-10-CM | POA: Diagnosis not present

## 2017-03-27 DIAGNOSIS — Z0000 Encounter for general adult medical examination without abnormal findings: Secondary | ICD-10-CM | POA: Diagnosis not present

## 2017-03-27 DIAGNOSIS — E668 Other obesity: Secondary | ICD-10-CM | POA: Diagnosis not present

## 2017-03-27 DIAGNOSIS — B1920 Unspecified viral hepatitis C without hepatic coma: Secondary | ICD-10-CM | POA: Diagnosis not present

## 2017-03-27 DIAGNOSIS — I2510 Atherosclerotic heart disease of native coronary artery without angina pectoris: Secondary | ICD-10-CM | POA: Diagnosis not present

## 2017-03-27 DIAGNOSIS — R7301 Impaired fasting glucose: Secondary | ICD-10-CM | POA: Diagnosis not present

## 2017-03-27 DIAGNOSIS — E7849 Other hyperlipidemia: Secondary | ICD-10-CM | POA: Diagnosis not present

## 2017-03-27 DIAGNOSIS — I10 Essential (primary) hypertension: Secondary | ICD-10-CM | POA: Diagnosis not present

## 2017-03-27 DIAGNOSIS — Z8673 Personal history of transient ischemic attack (TIA), and cerebral infarction without residual deficits: Secondary | ICD-10-CM | POA: Diagnosis not present

## 2017-03-30 ENCOUNTER — Other Ambulatory Visit: Payer: Self-pay | Admitting: Internal Medicine

## 2017-03-30 DIAGNOSIS — F17201 Nicotine dependence, unspecified, in remission: Secondary | ICD-10-CM

## 2017-04-10 ENCOUNTER — Ambulatory Visit: Payer: Medicare HMO

## 2017-04-10 ENCOUNTER — Ambulatory Visit
Admission: RE | Admit: 2017-04-10 | Discharge: 2017-04-10 | Disposition: A | Discharge status: Home / Self Care | Payer: Medicare HMO | Source: Ambulatory Visit | Attending: Internal Medicine | Admitting: Internal Medicine

## 2017-04-10 ENCOUNTER — Other Ambulatory Visit: Payer: Self-pay | Admitting: Internal Medicine

## 2017-04-10 DIAGNOSIS — F17201 Nicotine dependence, unspecified, in remission: Secondary | ICD-10-CM

## 2017-04-13 ENCOUNTER — Ambulatory Visit: Payer: Medicare HMO

## 2017-04-16 ENCOUNTER — Other Ambulatory Visit: Payer: Self-pay | Admitting: Internal Medicine

## 2017-04-16 DIAGNOSIS — F17201 Nicotine dependence, unspecified, in remission: Secondary | ICD-10-CM

## 2017-04-17 ENCOUNTER — Ambulatory Visit: Payer: Medicare HMO

## 2017-05-04 ENCOUNTER — Ambulatory Visit
Admission: RE | Admit: 2017-05-04 | Discharge: 2017-05-04 | Disposition: A | Discharge status: Home / Self Care | Payer: Medicare HMO | Source: Ambulatory Visit | Attending: Internal Medicine | Admitting: Internal Medicine

## 2017-05-04 DIAGNOSIS — Z87891 Personal history of nicotine dependence: Secondary | ICD-10-CM | POA: Diagnosis not present

## 2017-05-04 DIAGNOSIS — F17201 Nicotine dependence, unspecified, in remission: Secondary | ICD-10-CM

## 2017-08-14 DIAGNOSIS — W57XXXA Bitten or stung by nonvenomous insect and other nonvenomous arthropods, initial encounter: Secondary | ICD-10-CM | POA: Diagnosis not present

## 2017-08-14 DIAGNOSIS — L03116 Cellulitis of left lower limb: Secondary | ICD-10-CM | POA: Diagnosis not present

## 2017-08-14 DIAGNOSIS — L299 Pruritus, unspecified: Secondary | ICD-10-CM | POA: Diagnosis not present

## 2017-09-23 DIAGNOSIS — R1011 Right upper quadrant pain: Secondary | ICD-10-CM | POA: Diagnosis not present

## 2017-09-24 DIAGNOSIS — R1011 Right upper quadrant pain: Secondary | ICD-10-CM | POA: Diagnosis not present

## 2017-09-24 DIAGNOSIS — R1903 Right lower quadrant abdominal swelling, mass and lump: Secondary | ICD-10-CM | POA: Diagnosis not present

## 2017-09-24 DIAGNOSIS — K7460 Unspecified cirrhosis of liver: Secondary | ICD-10-CM | POA: Diagnosis not present

## 2017-09-24 DIAGNOSIS — Z7901 Long term (current) use of anticoagulants: Secondary | ICD-10-CM | POA: Diagnosis not present

## 2017-09-24 DIAGNOSIS — B1920 Unspecified viral hepatitis C without hepatic coma: Secondary | ICD-10-CM | POA: Diagnosis not present

## 2017-09-25 DIAGNOSIS — K7460 Unspecified cirrhosis of liver: Secondary | ICD-10-CM | POA: Diagnosis not present

## 2017-09-28 DIAGNOSIS — K7460 Unspecified cirrhosis of liver: Secondary | ICD-10-CM | POA: Diagnosis not present

## 2017-09-28 DIAGNOSIS — R1011 Right upper quadrant pain: Secondary | ICD-10-CM | POA: Diagnosis not present

## 2017-09-28 DIAGNOSIS — K652 Spontaneous bacterial peritonitis: Secondary | ICD-10-CM | POA: Diagnosis not present

## 2017-09-30 DIAGNOSIS — R188 Other ascites: Secondary | ICD-10-CM | POA: Diagnosis not present

## 2017-09-30 DIAGNOSIS — R1011 Right upper quadrant pain: Secondary | ICD-10-CM | POA: Diagnosis not present

## 2017-09-30 DIAGNOSIS — K652 Spontaneous bacterial peritonitis: Secondary | ICD-10-CM | POA: Diagnosis not present

## 2017-09-30 DIAGNOSIS — K7460 Unspecified cirrhosis of liver: Secondary | ICD-10-CM | POA: Diagnosis not present

## 2017-10-01 DIAGNOSIS — R140 Abdominal distension (gaseous): Secondary | ICD-10-CM | POA: Diagnosis not present

## 2017-10-01 DIAGNOSIS — Z79899 Other long term (current) drug therapy: Secondary | ICD-10-CM | POA: Insufficient documentation

## 2017-10-01 DIAGNOSIS — Z87891 Personal history of nicotine dependence: Secondary | ICD-10-CM | POA: Diagnosis not present

## 2017-10-01 DIAGNOSIS — K7031 Alcoholic cirrhosis of liver with ascites: Secondary | ICD-10-CM | POA: Diagnosis not present

## 2017-10-01 DIAGNOSIS — R1084 Generalized abdominal pain: Secondary | ICD-10-CM | POA: Diagnosis not present

## 2017-10-01 DIAGNOSIS — R188 Other ascites: Secondary | ICD-10-CM | POA: Diagnosis not present

## 2017-10-01 DIAGNOSIS — R39198 Other difficulties with micturition: Secondary | ICD-10-CM | POA: Diagnosis not present

## 2017-10-01 DIAGNOSIS — K7460 Unspecified cirrhosis of liver: Secondary | ICD-10-CM | POA: Diagnosis not present

## 2017-10-01 DIAGNOSIS — R109 Unspecified abdominal pain: Secondary | ICD-10-CM | POA: Diagnosis present

## 2017-10-01 DIAGNOSIS — R338 Other retention of urine: Secondary | ICD-10-CM | POA: Diagnosis not present

## 2017-10-01 DIAGNOSIS — I10 Essential (primary) hypertension: Secondary | ICD-10-CM | POA: Diagnosis not present

## 2017-10-02 ENCOUNTER — Emergency Department (HOSPITAL_COMMUNITY): Payer: Medicare HMO

## 2017-10-02 ENCOUNTER — Emergency Department (HOSPITAL_COMMUNITY)
Admission: EM | Admit: 2017-10-02 | Discharge: 2017-10-02 | Disposition: A | Discharge status: Home / Self Care | Payer: Medicare HMO | Attending: Emergency Medicine | Admitting: Emergency Medicine

## 2017-10-02 ENCOUNTER — Encounter (HOSPITAL_COMMUNITY): Payer: Self-pay

## 2017-10-02 ENCOUNTER — Other Ambulatory Visit: Payer: Self-pay

## 2017-10-02 DIAGNOSIS — R338 Other retention of urine: Secondary | ICD-10-CM

## 2017-10-02 DIAGNOSIS — K7460 Unspecified cirrhosis of liver: Secondary | ICD-10-CM

## 2017-10-02 DIAGNOSIS — R188 Other ascites: Secondary | ICD-10-CM

## 2017-10-02 DIAGNOSIS — R39198 Other difficulties with micturition: Secondary | ICD-10-CM

## 2017-10-02 DIAGNOSIS — K7290 Hepatic failure, unspecified without coma: Secondary | ICD-10-CM

## 2017-10-02 HISTORY — DX: Unspecified viral hepatitis C without hepatic coma: B19.20

## 2017-10-02 LAB — URINALYSIS, ROUTINE W REFLEX MICROSCOPIC
Bilirubin Urine: NEGATIVE
Glucose, UA: NEGATIVE mg/dL
Hgb urine dipstick: NEGATIVE
Ketones, ur: 5 mg/dL — AB
LEUKOCYTES UA: NEGATIVE
NITRITE: NEGATIVE
Protein, ur: NEGATIVE mg/dL
SPECIFIC GRAVITY, URINE: 1.025 (ref 1.005–1.030)
pH: 5 (ref 5.0–8.0)

## 2017-10-02 LAB — COMPREHENSIVE METABOLIC PANEL
ALK PHOS: 234 U/L — AB (ref 38–126)
ALT: 59 U/L — ABNORMAL HIGH (ref 0–44)
ANION GAP: 9 (ref 5–15)
AST: 141 U/L — ABNORMAL HIGH (ref 15–41)
Albumin: 2.3 g/dL — ABNORMAL LOW (ref 3.5–5.0)
BILIRUBIN TOTAL: 1.2 mg/dL (ref 0.3–1.2)
BUN: 14 mg/dL (ref 6–20)
CO2: 25 mmol/L (ref 22–32)
Calcium: 9.7 mg/dL (ref 8.9–10.3)
Chloride: 98 mmol/L (ref 98–111)
Creatinine, Ser: 0.61 mg/dL (ref 0.61–1.24)
GFR calc non Af Amer: >60 mL/min (ref >60)
GLUCOSE: 98 mg/dL (ref 70–99)
Potassium: 3.9 mmol/L (ref 3.5–5.1)
SODIUM: 132 mmol/L — AB (ref 135–145)
Total Protein: 6.5 g/dL (ref 6.5–8.1)

## 2017-10-02 LAB — CBC WITH DIFFERENTIAL/PLATELET
BASOS PCT: 0 %
Basophils Absolute: 0 10*3/uL (ref 0.0–0.1)
EOS ABS: 0 10*3/uL (ref 0.0–0.7)
Eosinophils Relative: 0 %
HEMATOCRIT: 39 % (ref 39.0–52.0)
HEMOGLOBIN: 13.5 g/dL (ref 13.0–17.0)
LYMPHS ABS: 1.2 10*3/uL (ref 0.7–4.0)
Lymphocytes Relative: 17 %
MCH: 30.9 pg (ref 26.0–34.0)
MCHC: 34.6 g/dL (ref 30.0–36.0)
MCV: 89.2 fL (ref 78.0–100.0)
MONOS PCT: 11 %
Monocytes Absolute: 0.8 10*3/uL (ref 0.1–1.0)
NEUTROS ABS: 5.2 10*3/uL (ref 1.7–7.7)
Neutrophils Relative %: 72 %
Platelets: 154 10*3/uL (ref 150–400)
RBC: 4.37 MIL/uL (ref 4.22–5.81)
RDW: 15.1 % (ref 11.5–15.5)
WBC: 7.3 10*3/uL (ref 4.0–10.5)

## 2017-10-02 LAB — LIPASE, BLOOD: Lipase: 63 U/L — ABNORMAL HIGH (ref 11–51)

## 2017-10-02 MED ORDER — IOPAMIDOL (ISOVUE-300) INJECTION 61%
100.0000 mL | Freq: Once | INTRAVENOUS | Status: AC | PRN
  Administered 2017-10-02: 100 mL via INTRAVENOUS

## 2017-10-02 MED ORDER — FENTANYL CITRATE (PF) 100 MCG/2ML IJ SOLN
50.0000 ug | Freq: Once | INTRAMUSCULAR | Status: AC
  Administered 2017-10-02: 50 ug via INTRAVENOUS
  Filled 2017-10-02: qty 2

## 2017-10-02 MED ORDER — IOPAMIDOL (ISOVUE-300) INJECTION 61%
INTRAVENOUS | Status: AC
  Administered 2017-10-02: 100 mL via INTRAVENOUS
  Filled 2017-10-02: qty 100

## 2017-10-02 MED ORDER — SILODOSIN 4 MG PO CAPS: 4.0000 mg | ORAL_CAPSULE | Freq: Every day | ORAL | 0 refills | Status: AC

## 2017-10-02 MED ORDER — LIDOCAINE HCL URETHRAL/MUCOSAL 2 % EX GEL
1.0000 "application " | Freq: Once | CUTANEOUS | Status: AC
  Administered 2017-10-02: 1 via URETHRAL
  Filled 2017-10-02: qty 5

## 2017-10-02 NOTE — ED Triage Notes (Signed)
Pt arriving via EMS with complaints of abdominal pain and urinary retention. Per EMS, pt's abdomen is distended and tender in all four quadrants.   Pt reports going to doctor yesterday for abdominal pain and difficulty urinating. Has not urinated all day.
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
## 2017-10-02 NOTE — ED Notes (Signed)
Bladder scan showed 560mL
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
## 2017-10-02 NOTE — ED Notes (Signed)
Bed: PC61 Expected date:  Expected time:  Means of arrival:  Comments: 50 M abd pain

## 2017-10-02 NOTE — Discharge Instructions (Addendum)
Take the silodosin for urination. Please follow up with your liver doctor in [facility].

## 2017-10-05 ENCOUNTER — Encounter (HOSPITAL_COMMUNITY): Payer: Self-pay | Admitting: Emergency Medicine

## 2017-10-05 ENCOUNTER — Observation Stay (HOSPITAL_COMMUNITY)
Admission: EM | Admit: 2017-10-05 | Discharge: 2017-10-07 | Disposition: A | Discharge status: Home / Self Care | Payer: Medicare HMO | Attending: Internal Medicine | Admitting: Internal Medicine

## 2017-10-05 ENCOUNTER — Emergency Department (HOSPITAL_COMMUNITY): Payer: Medicare HMO

## 2017-10-05 DIAGNOSIS — R0602 Shortness of breath: Secondary | ICD-10-CM | POA: Diagnosis not present

## 2017-10-05 DIAGNOSIS — Z79899 Other long term (current) drug therapy: Secondary | ICD-10-CM | POA: Diagnosis not present

## 2017-10-05 DIAGNOSIS — N400 Enlarged prostate without lower urinary tract symptoms: Secondary | ICD-10-CM | POA: Insufficient documentation

## 2017-10-05 DIAGNOSIS — Z96643 Presence of artificial hip joint, bilateral: Secondary | ICD-10-CM | POA: Diagnosis not present

## 2017-10-05 DIAGNOSIS — B182 Chronic viral hepatitis C: Secondary | ICD-10-CM | POA: Diagnosis not present

## 2017-10-05 DIAGNOSIS — Z880 Allergy status to penicillin: Secondary | ICD-10-CM | POA: Diagnosis not present

## 2017-10-05 DIAGNOSIS — R188 Other ascites: Principal | ICD-10-CM | POA: Insufficient documentation

## 2017-10-05 DIAGNOSIS — Z809 Family history of malignant neoplasm, unspecified: Secondary | ICD-10-CM | POA: Diagnosis not present

## 2017-10-05 DIAGNOSIS — F419 Anxiety disorder, unspecified: Secondary | ICD-10-CM | POA: Insufficient documentation

## 2017-10-05 DIAGNOSIS — Z87442 Personal history of urinary calculi: Secondary | ICD-10-CM | POA: Diagnosis not present

## 2017-10-05 DIAGNOSIS — Z8249 Family history of ischemic heart disease and other diseases of the circulatory system: Secondary | ICD-10-CM | POA: Diagnosis not present

## 2017-10-05 DIAGNOSIS — R748 Abnormal levels of other serum enzymes: Secondary | ICD-10-CM | POA: Diagnosis present

## 2017-10-05 DIAGNOSIS — Z87891 Personal history of nicotine dependence: Secondary | ICD-10-CM | POA: Diagnosis not present

## 2017-10-05 DIAGNOSIS — Z8673 Personal history of transient ischemic attack (TIA), and cerebral infarction without residual deficits: Secondary | ICD-10-CM | POA: Diagnosis not present

## 2017-10-05 DIAGNOSIS — R1084 Generalized abdominal pain: Secondary | ICD-10-CM

## 2017-10-05 DIAGNOSIS — Z885 Allergy status to narcotic agent status: Secondary | ICD-10-CM | POA: Diagnosis not present

## 2017-10-05 DIAGNOSIS — E872 Acidosis: Secondary | ICD-10-CM | POA: Diagnosis not present

## 2017-10-05 DIAGNOSIS — K7460 Unspecified cirrhosis of liver: Secondary | ICD-10-CM | POA: Diagnosis present

## 2017-10-05 DIAGNOSIS — Z882 Allergy status to sulfonamides status: Secondary | ICD-10-CM | POA: Diagnosis not present

## 2017-10-05 DIAGNOSIS — R609 Edema, unspecified: Secondary | ICD-10-CM | POA: Diagnosis not present

## 2017-10-05 DIAGNOSIS — R740 Nonspecific elevation of levels of transaminase and lactic acid dehydrogenase [LDH]: Secondary | ICD-10-CM | POA: Diagnosis not present

## 2017-10-05 DIAGNOSIS — R7989 Other specified abnormal findings of blood chemistry: Secondary | ICD-10-CM | POA: Diagnosis not present

## 2017-10-05 DIAGNOSIS — K7469 Other cirrhosis of liver: Secondary | ICD-10-CM | POA: Insufficient documentation

## 2017-10-05 DIAGNOSIS — K7290 Hepatic failure, unspecified without coma: Secondary | ICD-10-CM | POA: Diagnosis present

## 2017-10-05 DIAGNOSIS — R52 Pain, unspecified: Secondary | ICD-10-CM | POA: Diagnosis not present

## 2017-10-05 DIAGNOSIS — R1900 Intra-abdominal and pelvic swelling, mass and lump, unspecified site: Secondary | ICD-10-CM | POA: Diagnosis not present

## 2017-10-05 LAB — CBC WITH DIFFERENTIAL/PLATELET
BASOS ABS: 0 10*3/uL (ref 0.0–0.1)
Basophils Relative: 0 %
Eosinophils Absolute: 0 10*3/uL (ref 0.0–0.7)
Eosinophils Relative: 0 %
HEMATOCRIT: 42.2 % (ref 39.0–52.0)
HEMOGLOBIN: 14.6 g/dL (ref 13.0–17.0)
LYMPHS PCT: 8 %
Lymphs Abs: 0.7 10*3/uL (ref 0.7–4.0)
MCH: 30.9 pg (ref 26.0–34.0)
MCHC: 34.6 g/dL (ref 30.0–36.0)
MCV: 89.2 fL (ref 78.0–100.0)
Monocytes Absolute: 0.6 10*3/uL (ref 0.1–1.0)
Monocytes Relative: 7 %
NEUTROS ABS: 7.4 10*3/uL (ref 1.7–7.7)
Neutrophils Relative %: 85 %
Platelets: 143 10*3/uL — ABNORMAL LOW (ref 150–400)
RBC: 4.73 MIL/uL (ref 4.22–5.81)
RDW: 15.3 % (ref 11.5–15.5)
WBC: 8.8 10*3/uL (ref 4.0–10.5)

## 2017-10-05 LAB — COMPREHENSIVE METABOLIC PANEL
ALK PHOS: 332 U/L — AB (ref 38–126)
ALT: 88 U/L — AB (ref 0–44)
AST: 233 U/L — AB (ref 15–41)
Albumin: 2.5 g/dL — ABNORMAL LOW (ref 3.5–5.0)
Anion gap: 12 (ref 5–15)
BILIRUBIN TOTAL: 2.4 mg/dL — AB (ref 0.3–1.2)
BUN: 11 mg/dL (ref 6–20)
CALCIUM: 10.3 mg/dL (ref 8.9–10.3)
CHLORIDE: 94 mmol/L — AB (ref 98–111)
CO2: 26 mmol/L (ref 22–32)
CREATININE: 0.58 mg/dL — AB (ref 0.61–1.24)
Glucose, Bld: 97 mg/dL (ref 70–99)
Potassium: 3.6 mmol/L (ref 3.5–5.1)
Sodium: 132 mmol/L — ABNORMAL LOW (ref 135–145)
TOTAL PROTEIN: 7.5 g/dL (ref 6.5–8.1)

## 2017-10-05 LAB — LIPASE, BLOOD: LIPASE: 36 U/L (ref 11–51)

## 2017-10-05 LAB — AMMONIA: Ammonia: 23 umol/L (ref 9–35)

## 2017-10-05 LAB — BRAIN NATRIURETIC PEPTIDE: B Natriuretic Peptide: 68.8 pg/mL (ref 0.0–100.0)

## 2017-10-05 LAB — I-STAT CG4 LACTIC ACID, ED: Lactic Acid, Venous: 3.38 mmol/L (ref 0.5–1.9)

## 2017-10-05 LAB — PROTIME-INR
INR: 1.12
PROTHROMBIN TIME: 14.3 s (ref 11.4–15.2)

## 2017-10-05 MED ORDER — FENTANYL CITRATE (PF) 100 MCG/2ML IJ SOLN
50.0000 ug | Freq: Once | INTRAMUSCULAR | Status: AC
  Administered 2017-10-05: 50 ug via INTRAVENOUS
  Filled 2017-10-05: qty 2
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
## 2017-10-05 NOTE — ED Triage Notes (Signed)
Patient complains of abdominal pain and swelling for 2 weeks. He seen last week and given lasix. Patient reports he has worsened since. Current pain level is a 10/10.

## 2017-10-06 ENCOUNTER — Other Ambulatory Visit: Payer: Self-pay

## 2017-10-06 ENCOUNTER — Observation Stay (HOSPITAL_COMMUNITY): Payer: Medicare HMO

## 2017-10-06 DIAGNOSIS — R748 Abnormal levels of other serum enzymes: Secondary | ICD-10-CM | POA: Diagnosis not present

## 2017-10-06 DIAGNOSIS — K7290 Hepatic failure, unspecified without coma: Secondary | ICD-10-CM | POA: Diagnosis not present

## 2017-10-06 DIAGNOSIS — B182 Chronic viral hepatitis C: Secondary | ICD-10-CM | POA: Diagnosis not present

## 2017-10-06 DIAGNOSIS — R188 Other ascites: Secondary | ICD-10-CM | POA: Diagnosis present

## 2017-10-06 LAB — BODY FLUID CELL COUNT WITH DIFFERENTIAL
LYMPHS FL: 38 %
MONOCYTE-MACROPHAGE-SEROUS FLUID: 58 % (ref 50–90)
Neutrophil Count, Fluid: 4 % (ref 0–25)
Total Nucleated Cell Count, Fluid: 74 cu mm (ref 0–1000)

## 2017-10-06 LAB — GRAM STAIN: Gram Stain: NONE SEEN

## 2017-10-06 LAB — BASIC METABOLIC PANEL
Anion gap: 10 (ref 5–15)
BUN: 11 mg/dL (ref 6–20)
CALCIUM: 10 mg/dL (ref 8.9–10.3)
CO2: 26 mmol/L (ref 22–32)
Chloride: 96 mmol/L — ABNORMAL LOW (ref 98–111)
Creatinine, Ser: 0.48 mg/dL — ABNORMAL LOW (ref 0.61–1.24)
GFR calc Af Amer: >60 mL/min (ref >60)
Glucose, Bld: 93 mg/dL (ref 70–99)
Potassium: 3.8 mmol/L (ref 3.5–5.1)
SODIUM: 132 mmol/L — AB (ref 135–145)

## 2017-10-06 LAB — URINALYSIS, ROUTINE W REFLEX MICROSCOPIC
Bilirubin Urine: NEGATIVE
Glucose, UA: NEGATIVE mg/dL
Hgb urine dipstick: NEGATIVE
Ketones, ur: NEGATIVE mg/dL
LEUKOCYTES UA: NEGATIVE
NITRITE: NEGATIVE
PROTEIN: NEGATIVE mg/dL
Specific Gravity, Urine: 1.024 (ref 1.005–1.030)
pH: 5 (ref 5.0–8.0)

## 2017-10-06 LAB — LACTATE DEHYDROGENASE, PLEURAL OR PERITONEAL FLUID: LD FL: 79 U/L — AB (ref 3–23)

## 2017-10-06 LAB — I-STAT CG4 LACTIC ACID, ED: LACTIC ACID, VENOUS: 1.67 mmol/L (ref 0.5–1.9)

## 2017-10-06 LAB — PROTEIN, PLEURAL OR PERITONEAL FLUID

## 2017-10-06 LAB — HIV ANTIBODY (ROUTINE TESTING W REFLEX): HIV SCREEN 4TH GENERATION: NONREACTIVE

## 2017-10-06 MED ORDER — SPIRONOLACTONE 25 MG PO TABS
50.0000 mg | ORAL_TABLET | Freq: Every day | ORAL | Status: DC
  Filled 2017-10-06: qty 2

## 2017-10-06 MED ORDER — FUROSEMIDE 40 MG PO TABS
20.0000 mg | ORAL_TABLET | Freq: Every day | ORAL | Status: DC
  Administered 2017-10-06 – 2017-10-07 (×2): 20 mg via ORAL
  Filled 2017-10-06 (×3): qty 1

## 2017-10-06 MED ORDER — TAMSULOSIN HCL 0.4 MG PO CAPS
0.4000 mg | ORAL_CAPSULE | Freq: Every day | ORAL | Status: DC
  Administered 2017-10-06 – 2017-10-07 (×2): 0.4 mg via ORAL
  Filled 2017-10-06 (×2): qty 1

## 2017-10-06 MED ORDER — ONDANSETRON HCL 4 MG/2ML IJ SOLN: 4.0000 mg | Freq: Four times a day (QID) | INTRAMUSCULAR | Status: DC | PRN

## 2017-10-06 MED ORDER — ALPRAZOLAM 1 MG PO TABS: 1.0000 mg | ORAL_TABLET | Freq: Every evening | ORAL | Status: DC | PRN

## 2017-10-06 MED ORDER — FENTANYL CITRATE (PF) 100 MCG/2ML IJ SOLN
50.0000 ug | Freq: Once | INTRAMUSCULAR | Status: AC
  Administered 2017-10-06: 50 ug via INTRAVENOUS
  Filled 2017-10-06: qty 2

## 2017-10-06 MED ORDER — ACETAMINOPHEN 325 MG PO TABS
650.0000 mg | ORAL_TABLET | Freq: Four times a day (QID) | ORAL | Status: DC | PRN
  Administered 2017-10-07: 650 mg via ORAL
  Filled 2017-10-06: qty 2

## 2017-10-06 MED ORDER — ACETAMINOPHEN 650 MG RE SUPP: 650.0000 mg | Freq: Four times a day (QID) | RECTAL | Status: DC | PRN

## 2017-10-06 MED ORDER — SPIRONOLACTONE 100 MG PO TABS
100.0000 mg | ORAL_TABLET | Freq: Every day | ORAL | Status: DC
  Administered 2017-10-07: 100 mg via ORAL
  Filled 2017-10-06 (×2): qty 1

## 2017-10-06 MED ORDER — FENTANYL CITRATE (PF) 100 MCG/2ML IJ SOLN
50.0000 ug | INTRAMUSCULAR | Status: DC | PRN
  Administered 2017-10-06 – 2017-10-07 (×9): 50 ug via INTRAVENOUS
  Filled 2017-10-06 (×9): qty 2

## 2017-10-06 MED ORDER — LIDOCAINE HCL 1 % IJ SOLN
INTRAMUSCULAR | Status: AC
  Filled 2017-10-06: qty 20

## 2017-10-06 MED ORDER — OXYCODONE HCL 5 MG PO TABS
5.0000 mg | ORAL_TABLET | Freq: Four times a day (QID) | ORAL | Status: DC | PRN
  Administered 2017-10-06 – 2017-10-07 (×2): 5 mg via ORAL
  Filled 2017-10-06 (×2): qty 1

## 2017-10-06 MED ORDER — HYDROCHLOROTHIAZIDE 12.5 MG PO CAPS
12.5000 mg | ORAL_CAPSULE | Freq: Every day | ORAL | Status: DC
  Administered 2017-10-06 – 2017-10-07 (×2): 12.5 mg via ORAL
  Filled 2017-10-06 (×2): qty 1

## 2017-10-06 MED ORDER — ONDANSETRON HCL 4 MG PO TABS: 4.0000 mg | ORAL_TABLET | Freq: Four times a day (QID) | ORAL | Status: DC | PRN

## 2017-10-06 MED ORDER — ALBUMIN HUMAN 5 % IV SOLN
12.5000 g | Freq: Once | INTRAVENOUS | Status: DC | PRN
  Filled 2017-10-06: qty 500
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
## 2017-10-06 NOTE — Procedures (Signed)
Ultrasound-guided diagnostic and therapeutic paracentesis performed yielding 4.1 liters of yellow fluid. No immediate complications. A portion of the fluid was sent to the lab for preordered studies.
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
## 2017-10-07 DIAGNOSIS — R188 Other ascites: Secondary | ICD-10-CM | POA: Diagnosis not present

## 2017-10-07 DIAGNOSIS — N400 Enlarged prostate without lower urinary tract symptoms: Secondary | ICD-10-CM

## 2017-10-07 DIAGNOSIS — B182 Chronic viral hepatitis C: Secondary | ICD-10-CM | POA: Diagnosis not present

## 2017-10-07 DIAGNOSIS — R748 Abnormal levels of other serum enzymes: Secondary | ICD-10-CM

## 2017-10-07 LAB — URINE CULTURE: Culture: <10000 — AB

## 2017-10-07 LAB — BASIC METABOLIC PANEL
Anion gap: 11 (ref 5–15)
BUN: 14 mg/dL (ref 6–20)
CO2: 26 mmol/L (ref 22–32)
Calcium: 10.4 mg/dL — ABNORMAL HIGH (ref 8.9–10.3)
Chloride: 95 mmol/L — ABNORMAL LOW (ref 98–111)
Creatinine, Ser: 0.47 mg/dL — ABNORMAL LOW (ref 0.61–1.24)
GFR calc Af Amer: >60 mL/min (ref >60)
GLUCOSE: 89 mg/dL (ref 70–99)
POTASSIUM: 3.8 mmol/L (ref 3.5–5.1)
Sodium: 132 mmol/L — ABNORMAL LOW (ref 135–145)

## 2017-10-07 LAB — HCV RNA QUANT: HCV Quantitative: NOT DETECTED IU/mL (ref >50)

## 2017-10-07 LAB — PH, BODY FLUID: pH, Body Fluid: 7.5

## 2017-10-07 MED ORDER — HYDROCHLOROTHIAZIDE 12.5 MG PO CAPS: 12.5000 mg | ORAL_CAPSULE | Freq: Every day | ORAL | 0 refills | Status: DC

## 2017-10-07 MED ORDER — SPIRONOLACTONE 100 MG PO TABS: 100.0000 mg | ORAL_TABLET | Freq: Every day | ORAL | 0 refills | Status: AC

## 2017-10-07 MED ORDER — FUROSEMIDE 20 MG PO TABS: 20.0000 mg | ORAL_TABLET | Freq: Every day | ORAL | 1 refills | Status: DC

## 2017-10-07 NOTE — Progress Notes (Signed)
Patient discharged to home with family. Given all belongings and instructions. Patient verbalized understanding. Escorted to pov via w/c.
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
## 2017-10-07 NOTE — Discharge Instructions (Signed)
Weight yourself and measure your abdomen every day. If you are gaining fluid, speak with your PCP. See your PCP in 2-3 days and have the following blood work done: Sales promotion account executive. Do not drink too much water (2 L Max) and do not eat salt.     You were cared for by a hospitalist during your hospital stay. If you have any questions about your discharge medications or the care you received while you were in the hospital after you are discharged, you can call the unit and asked to speak with the hospitalist on call if the hospitalist that took care of you is not available. Once you are discharged, your primary care physician will handle any further medical issues.   Please note that NO REFILLS for any discharge medications will be authorized once you are discharged, as it is imperative that you return to your primary care physician (or establish a relationship with a primary care physician if you do not have one) for your aftercare needs so that they can reassess your need for medications and monitor your lab values.  Please take all your medications with you for your next visit with your Primary MD. Please ask your Primary MD to get all Hospital records sent to his/her office. Please request your Primary MD to go over all hospital test results at the follow up.   If you experience worsening of your admission symptoms, develop shortness of breath, chest pain, suicidal or homicidal thoughts or a life threatening emergency, you must seek medical attention immediately by calling 911 or calling your MD.   Dennis Bast must read the complete instructions/literature along with all the possible adverse reactions/side effects for all the medicines you take including new medications that have been prescribed to you. Take new medicines after you have completely understood and accpet all the possible adverse reactions/side effects.    Do not drive when taking pain medications or sedatives.     Do not take more than  prescribed Pain, Sleep and Anxiety Medications   If you have smoked or chewed Tobacco in the last 2 yrs please stop. Stop any regular alcohol  and or recreational drug use.   Wear Seat belts while driving.

## 2017-10-10 ENCOUNTER — Inpatient Hospital Stay (HOSPITAL_COMMUNITY)
Admission: EM | Admit: 2017-10-10 | Discharge: 2017-10-14 | DRG: 433 | Disposition: A | Discharge status: Home / Self Care | Payer: Medicare HMO | Attending: Internal Medicine | Admitting: Internal Medicine

## 2017-10-10 ENCOUNTER — Emergency Department (HOSPITAL_COMMUNITY): Payer: Medicare HMO

## 2017-10-10 ENCOUNTER — Encounter (HOSPITAL_COMMUNITY): Payer: Self-pay

## 2017-10-10 ENCOUNTER — Other Ambulatory Visit: Payer: Self-pay

## 2017-10-10 DIAGNOSIS — R188 Other ascites: Secondary | ICD-10-CM | POA: Diagnosis present

## 2017-10-10 DIAGNOSIS — C787 Secondary malignant neoplasm of liver and intrahepatic bile duct: Secondary | ICD-10-CM | POA: Diagnosis present

## 2017-10-10 DIAGNOSIS — D6959 Other secondary thrombocytopenia: Secondary | ICD-10-CM | POA: Diagnosis present

## 2017-10-10 DIAGNOSIS — D696 Thrombocytopenia, unspecified: Secondary | ICD-10-CM

## 2017-10-10 DIAGNOSIS — Z87891 Personal history of nicotine dependence: Secondary | ICD-10-CM | POA: Diagnosis not present

## 2017-10-10 DIAGNOSIS — R109 Unspecified abdominal pain: Secondary | ICD-10-CM | POA: Diagnosis not present

## 2017-10-10 DIAGNOSIS — N400 Enlarged prostate without lower urinary tract symptoms: Secondary | ICD-10-CM | POA: Diagnosis present

## 2017-10-10 DIAGNOSIS — B182 Chronic viral hepatitis C: Secondary | ICD-10-CM | POA: Diagnosis present

## 2017-10-10 DIAGNOSIS — R1084 Generalized abdominal pain: Secondary | ICD-10-CM

## 2017-10-10 DIAGNOSIS — E872 Acidosis, unspecified: Secondary | ICD-10-CM

## 2017-10-10 DIAGNOSIS — E871 Hypo-osmolality and hyponatremia: Secondary | ICD-10-CM | POA: Diagnosis present

## 2017-10-10 DIAGNOSIS — Z8673 Personal history of transient ischemic attack (TIA), and cerebral infarction without residual deficits: Secondary | ICD-10-CM | POA: Diagnosis not present

## 2017-10-10 DIAGNOSIS — R160 Hepatomegaly, not elsewhere classified: Secondary | ICD-10-CM | POA: Diagnosis not present

## 2017-10-10 DIAGNOSIS — D490 Neoplasm of unspecified behavior of digestive system: Secondary | ICD-10-CM | POA: Diagnosis not present

## 2017-10-10 DIAGNOSIS — R7401 Elevation of levels of liver transaminase levels: Secondary | ICD-10-CM

## 2017-10-10 DIAGNOSIS — K769 Liver disease, unspecified: Secondary | ICD-10-CM | POA: Diagnosis present

## 2017-10-10 DIAGNOSIS — C7951 Secondary malignant neoplasm of bone: Secondary | ICD-10-CM | POA: Diagnosis present

## 2017-10-10 DIAGNOSIS — Z79899 Other long term (current) drug therapy: Secondary | ICD-10-CM

## 2017-10-10 DIAGNOSIS — K7290 Hepatic failure, unspecified without coma: Secondary | ICD-10-CM | POA: Diagnosis not present

## 2017-10-10 DIAGNOSIS — K5900 Constipation, unspecified: Secondary | ICD-10-CM | POA: Diagnosis present

## 2017-10-10 DIAGNOSIS — R52 Pain, unspecified: Secondary | ICD-10-CM | POA: Diagnosis not present

## 2017-10-10 DIAGNOSIS — Z96643 Presence of artificial hip joint, bilateral: Secondary | ICD-10-CM | POA: Diagnosis present

## 2017-10-10 DIAGNOSIS — K7031 Alcoholic cirrhosis of liver with ascites: Secondary | ICD-10-CM | POA: Diagnosis not present

## 2017-10-10 DIAGNOSIS — K7689 Other specified diseases of liver: Secondary | ICD-10-CM | POA: Diagnosis not present

## 2017-10-10 DIAGNOSIS — R740 Nonspecific elevation of levels of transaminase and lactic acid dehydrogenase [LDH]: Secondary | ICD-10-CM | POA: Diagnosis not present

## 2017-10-10 DIAGNOSIS — K7460 Unspecified cirrhosis of liver: Secondary | ICD-10-CM | POA: Diagnosis present

## 2017-10-10 DIAGNOSIS — R609 Edema, unspecified: Secondary | ICD-10-CM | POA: Diagnosis not present

## 2017-10-10 DIAGNOSIS — C227 Other specified carcinomas of liver: Secondary | ICD-10-CM | POA: Diagnosis not present

## 2017-10-10 LAB — COMPREHENSIVE METABOLIC PANEL
ALBUMIN: 2.6 g/dL — AB (ref 3.5–5.0)
ALT: 80 U/L — ABNORMAL HIGH (ref 0–44)
ANION GAP: 17 — AB (ref 5–15)
AST: 223 U/L — ABNORMAL HIGH (ref 15–41)
Alkaline Phosphatase: 317 U/L — ABNORMAL HIGH (ref 38–126)
BILIRUBIN TOTAL: 3 mg/dL — AB (ref 0.3–1.2)
BUN: 15 mg/dL (ref 6–20)
CO2: 26 mmol/L (ref 22–32)
Calcium: 10.8 mg/dL — ABNORMAL HIGH (ref 8.9–10.3)
Chloride: 88 mmol/L — ABNORMAL LOW (ref 98–111)
Creatinine, Ser: 0.72 mg/dL (ref 0.61–1.24)
GFR calc non Af Amer: >60 mL/min (ref >60)
GLUCOSE: 102 mg/dL — AB (ref 70–99)
POTASSIUM: 3.5 mmol/L (ref 3.5–5.1)
SODIUM: 131 mmol/L — AB (ref 135–145)
Total Protein: 7.9 g/dL (ref 6.5–8.1)

## 2017-10-10 LAB — URINALYSIS, ROUTINE W REFLEX MICROSCOPIC
BILIRUBIN URINE: NEGATIVE
Glucose, UA: NEGATIVE mg/dL
KETONES UR: 5 mg/dL — AB
LEUKOCYTES UA: NEGATIVE
NITRITE: NEGATIVE
PROTEIN: NEGATIVE mg/dL
SPECIFIC GRAVITY, URINE: 1.043 — AB (ref 1.005–1.030)
pH: 6 (ref 5.0–8.0)

## 2017-10-10 LAB — MAGNESIUM: Magnesium: 1.9 mg/dL (ref 1.7–2.4)

## 2017-10-10 LAB — CBC
HEMATOCRIT: 46.4 % (ref 39.0–52.0)
HEMOGLOBIN: 16 g/dL (ref 13.0–17.0)
MCH: 31 pg (ref 26.0–34.0)
MCHC: 34.5 g/dL (ref 30.0–36.0)
MCV: 89.9 fL (ref 78.0–100.0)
Platelets: 148 10*3/uL — ABNORMAL LOW (ref 150–400)
RBC: 5.16 MIL/uL (ref 4.22–5.81)
RDW: 15.9 % — ABNORMAL HIGH (ref 11.5–15.5)
WBC: 9.5 10*3/uL (ref 4.0–10.5)

## 2017-10-10 LAB — LIPASE, BLOOD: Lipase: 34 U/L (ref 11–51)

## 2017-10-10 LAB — I-STAT CG4 LACTIC ACID, ED: LACTIC ACID, VENOUS: 4.34 mmol/L — AB (ref 0.5–1.9)

## 2017-10-10 MED ORDER — IOPAMIDOL (ISOVUE-300) INJECTION 61%
INTRAVENOUS | Status: AC
  Filled 2017-10-10: qty 100

## 2017-10-10 MED ORDER — SODIUM CHLORIDE 0.9 % IJ SOLN
INTRAMUSCULAR | Status: AC
  Filled 2017-10-10: qty 50

## 2017-10-10 MED ORDER — SPIRONOLACTONE 100 MG PO TABS
100.0000 mg | ORAL_TABLET | Freq: Every day | ORAL | Status: DC
  Administered 2017-10-11 – 2017-10-14 (×3): 100 mg via ORAL
  Filled 2017-10-10 (×3): qty 1

## 2017-10-10 MED ORDER — TAMSULOSIN HCL 0.4 MG PO CAPS
0.4000 mg | ORAL_CAPSULE | Freq: Every day | ORAL | Status: DC
  Administered 2017-10-11 – 2017-10-14 (×3): 0.4 mg via ORAL
  Filled 2017-10-10 (×3): qty 1

## 2017-10-10 MED ORDER — HYDROMORPHONE HCL 1 MG/ML IJ SOLN: 1.0000 mg | Freq: Once | INTRAMUSCULAR | Status: DC

## 2017-10-10 MED ORDER — MORPHINE SULFATE (PF) 4 MG/ML IV SOLN
6.0000 mg | Freq: Once | INTRAVENOUS | Status: AC
  Administered 2017-10-10: 6 mg via INTRAVENOUS
  Filled 2017-10-10: qty 2

## 2017-10-10 MED ORDER — ONDANSETRON HCL 4 MG PO TABS: 4.0000 mg | ORAL_TABLET | Freq: Four times a day (QID) | ORAL | Status: DC | PRN

## 2017-10-10 MED ORDER — ONDANSETRON HCL 4 MG/2ML IJ SOLN: 4.0000 mg | Freq: Four times a day (QID) | INTRAMUSCULAR | Status: DC | PRN

## 2017-10-10 MED ORDER — ONDANSETRON HCL 4 MG/2ML IJ SOLN
4.0000 mg | Freq: Once | INTRAMUSCULAR | Status: AC
  Administered 2017-10-10: 4 mg via INTRAVENOUS
  Filled 2017-10-10: qty 2

## 2017-10-10 MED ORDER — ACETAMINOPHEN 650 MG RE SUPP: 650.0000 mg | Freq: Four times a day (QID) | RECTAL | Status: DC | PRN

## 2017-10-10 MED ORDER — ACETAMINOPHEN 325 MG PO TABS: 650.0000 mg | ORAL_TABLET | Freq: Four times a day (QID) | ORAL | Status: DC | PRN

## 2017-10-10 MED ORDER — ALBUMIN HUMAN 5 % IV SOLN
12.5000 g | Freq: Once | INTRAVENOUS | Status: AC
  Administered 2017-10-11: 12.5 g via INTRAVENOUS
  Filled 2017-10-10: qty 250

## 2017-10-10 MED ORDER — FUROSEMIDE 40 MG PO TABS: 20.0000 mg | ORAL_TABLET | Freq: Every day | ORAL | Status: DC

## 2017-10-10 MED ORDER — IOPAMIDOL (ISOVUE-300) INJECTION 61%
100.0000 mL | Freq: Once | INTRAVENOUS | Status: AC | PRN
  Administered 2017-10-10: 100 mL via INTRAVENOUS

## 2017-10-10 MED ORDER — MORPHINE SULFATE (PF) 4 MG/ML IV SOLN
6.0000 mg | INTRAVENOUS | Status: DC | PRN
  Administered 2017-10-11: 6 mg via INTRAVENOUS
  Filled 2017-10-10: qty 2

## 2017-10-10 MED ORDER — LACTULOSE 10 GM/15ML PO SOLN
10.0000 g | Freq: Three times a day (TID) | ORAL | Status: DC
  Administered 2017-10-11 (×4): 10 g via ORAL
  Filled 2017-10-10 (×4): qty 15

## 2017-10-10 MED ORDER — HYDROCHLOROTHIAZIDE 12.5 MG PO CAPS: 12.5000 mg | ORAL_CAPSULE | Freq: Every day | ORAL | Status: DC

## 2017-10-10 MED ORDER — ENOXAPARIN SODIUM 40 MG/0.4ML ~~LOC~~ SOLN
40.0000 mg | Freq: Every day | SUBCUTANEOUS | Status: DC
  Administered 2017-10-11 – 2017-10-12 (×2): 40 mg via SUBCUTANEOUS
  Filled 2017-10-10 (×2): qty 0.4

## 2017-10-10 MED ORDER — ALPRAZOLAM 1 MG PO TABS
1.0000 mg | ORAL_TABLET | Freq: Every evening | ORAL | Status: DC | PRN
  Administered 2017-10-11 – 2017-10-13 (×2): 1 mg via ORAL
  Filled 2017-10-10 (×2): qty 1

## 2017-10-10 NOTE — ED Notes (Signed)
Pt states that he still cannot provide a urine sample
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
## 2017-10-10 NOTE — ED Triage Notes (Signed)
Per EMS- Patient c/o abdominal pain x 2 weeks and patient  Reports that he has had fluid drained from his abdomen twice this week. Patient states he was suppose to see a specialist on Monday, but has had increased abdominal pain and swelling.

## 2017-10-10 NOTE — ED Notes (Signed)
Pt notified of labs needing to be drawn-pt keeps asking for pain medication first and stating that he has already been stuck once.

## 2017-10-10 NOTE — ED Notes (Signed)
Pt aware that urine sample is needed.  
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
## 2017-10-11 ENCOUNTER — Observation Stay (HOSPITAL_COMMUNITY): Payer: Medicare HMO

## 2017-10-11 DIAGNOSIS — R188 Other ascites: Secondary | ICD-10-CM | POA: Diagnosis not present

## 2017-10-11 DIAGNOSIS — D696 Thrombocytopenia, unspecified: Secondary | ICD-10-CM

## 2017-10-11 LAB — COMPREHENSIVE METABOLIC PANEL
ALT: 65 U/L — ABNORMAL HIGH (ref 0–44)
ANION GAP: 11 (ref 5–15)
AST: 175 U/L — ABNORMAL HIGH (ref 15–41)
Albumin: 2.6 g/dL — ABNORMAL LOW (ref 3.5–5.0)
Alkaline Phosphatase: 251 U/L — ABNORMAL HIGH (ref 38–126)
BILIRUBIN TOTAL: 2.6 mg/dL — AB (ref 0.3–1.2)
BUN: 18 mg/dL (ref 6–20)
CHLORIDE: 93 mmol/L — AB (ref 98–111)
CO2: 29 mmol/L (ref 22–32)
Calcium: 10.8 mg/dL — ABNORMAL HIGH (ref 8.9–10.3)
Creatinine, Ser: 0.58 mg/dL — ABNORMAL LOW (ref 0.61–1.24)
Glucose, Bld: 102 mg/dL — ABNORMAL HIGH (ref 70–99)
POTASSIUM: 3.7 mmol/L (ref 3.5–5.1)
Sodium: 133 mmol/L — ABNORMAL LOW (ref 135–145)
TOTAL PROTEIN: 6.9 g/dL (ref 6.5–8.1)

## 2017-10-11 LAB — CBC
HCT: 39 % (ref 39.0–52.0)
Hemoglobin: 13.4 g/dL (ref 13.0–17.0)
MCH: 30.5 pg (ref 26.0–34.0)
MCHC: 34.4 g/dL (ref 30.0–36.0)
MCV: 88.8 fL (ref 78.0–100.0)
PLATELETS: 132 10*3/uL — AB (ref 150–400)
RBC: 4.39 MIL/uL (ref 4.22–5.81)
RDW: 16.2 % — AB (ref 11.5–15.5)
WBC: 7.3 10*3/uL (ref 4.0–10.5)

## 2017-10-11 LAB — BODY FLUID CELL COUNT WITH DIFFERENTIAL
Lymphs, Fluid: 65 %
MONOCYTE-MACROPHAGE-SEROUS FLUID: 31 % — AB (ref 50–90)
Neutrophil Count, Fluid: 4 % (ref 0–25)
Total Nucleated Cell Count, Fluid: 153 cu mm (ref 0–1000)

## 2017-10-11 LAB — CULTURE, BODY FLUID W GRAM STAIN -BOTTLE: Culture: NO GROWTH

## 2017-10-11 LAB — GLUCOSE, PLEURAL OR PERITONEAL FLUID: GLUCOSE FL: 98 mg/dL

## 2017-10-11 LAB — LACTIC ACID, PLASMA
Lactic Acid, Venous: 1.6 mmol/L (ref 0.5–1.9)
Lactic Acid, Venous: 1.9 mmol/L (ref 0.5–1.9)

## 2017-10-11 LAB — CULTURE, BODY FLUID-BOTTLE

## 2017-10-11 MED ORDER — ENSURE ENLIVE PO LIQD
237.0000 mL | Freq: Two times a day (BID) | ORAL | Status: DC
  Administered 2017-10-11 (×2): 237 mL via ORAL

## 2017-10-11 MED ORDER — MORPHINE SULFATE (PF) 2 MG/ML IV SOLN
2.0000 mg | INTRAVENOUS | Status: DC | PRN
  Administered 2017-10-11 – 2017-10-13 (×8): 2 mg via INTRAVENOUS
  Filled 2017-10-11 (×8): qty 1

## 2017-10-11 MED ORDER — ALBUMIN HUMAN 25 % IV SOLN: 25.0000 g | Freq: Once | INTRAVENOUS | Status: DC

## 2017-10-11 MED ORDER — LIDOCAINE HCL 1 % IJ SOLN
INTRAMUSCULAR | Status: AC
  Filled 2017-10-11: qty 10

## 2017-10-11 MED ORDER — FUROSEMIDE 40 MG PO TABS
40.0000 mg | ORAL_TABLET | Freq: Every day | ORAL | Status: DC
  Administered 2017-10-11: 40 mg via ORAL
  Filled 2017-10-11: qty 1

## 2017-10-11 MED ORDER — ALBUMIN HUMAN 25 % IV SOLN
25.0000 g | INTRAVENOUS | Status: AC
  Administered 2017-10-11: 25 g via INTRAVENOUS
  Filled 2017-10-11: qty 100
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
## 2017-10-11 NOTE — Progress Notes (Signed)
Pt arrived to unit room 1519 via stretcher. Alert and oriented x4. Steady gait from stretcher to bed. Pain 0/10 at this time. VS taken. Oriented pt to room and callbell with no complications. Pt guide at the bedside. Initial assessment completed. Will continue to monitor.

## 2017-10-11 NOTE — Procedures (Signed)
PROCEDURE SUMMARY:  Successful US guided paracentesis from right lateral abdomen.  Yielded 2.1 liters of clear yellow fluid.  No immediate complications.  Patient tolerated well.   Specimen was sent for labs.  WENDY S BLAIR PA-C 10/11/2017 12:51 PM

## 2017-10-12 ENCOUNTER — Observation Stay (HOSPITAL_COMMUNITY): Payer: Medicare HMO

## 2017-10-12 DIAGNOSIS — C7951 Secondary malignant neoplasm of bone: Secondary | ICD-10-CM | POA: Diagnosis present

## 2017-10-12 DIAGNOSIS — N400 Enlarged prostate without lower urinary tract symptoms: Secondary | ICD-10-CM | POA: Diagnosis present

## 2017-10-12 DIAGNOSIS — B182 Chronic viral hepatitis C: Secondary | ICD-10-CM | POA: Diagnosis present

## 2017-10-12 DIAGNOSIS — K7460 Unspecified cirrhosis of liver: Secondary | ICD-10-CM | POA: Diagnosis present

## 2017-10-12 DIAGNOSIS — R109 Unspecified abdominal pain: Secondary | ICD-10-CM | POA: Diagnosis not present

## 2017-10-12 DIAGNOSIS — Z96643 Presence of artificial hip joint, bilateral: Secondary | ICD-10-CM | POA: Diagnosis present

## 2017-10-12 DIAGNOSIS — Z79899 Other long term (current) drug therapy: Secondary | ICD-10-CM | POA: Diagnosis not present

## 2017-10-12 DIAGNOSIS — K5900 Constipation, unspecified: Secondary | ICD-10-CM | POA: Diagnosis present

## 2017-10-12 DIAGNOSIS — Z87891 Personal history of nicotine dependence: Secondary | ICD-10-CM | POA: Diagnosis not present

## 2017-10-12 DIAGNOSIS — E871 Hypo-osmolality and hyponatremia: Secondary | ICD-10-CM | POA: Diagnosis present

## 2017-10-12 DIAGNOSIS — R188 Other ascites: Secondary | ICD-10-CM | POA: Diagnosis present

## 2017-10-12 DIAGNOSIS — Z8673 Personal history of transient ischemic attack (TIA), and cerebral infarction without residual deficits: Secondary | ICD-10-CM | POA: Diagnosis not present

## 2017-10-12 DIAGNOSIS — E872 Acidosis: Secondary | ICD-10-CM | POA: Diagnosis present

## 2017-10-12 DIAGNOSIS — C787 Secondary malignant neoplasm of liver and intrahepatic bile duct: Secondary | ICD-10-CM | POA: Diagnosis present

## 2017-10-12 DIAGNOSIS — D6959 Other secondary thrombocytopenia: Secondary | ICD-10-CM | POA: Diagnosis present

## 2017-10-12 DIAGNOSIS — K7290 Hepatic failure, unspecified without coma: Secondary | ICD-10-CM | POA: Diagnosis not present

## 2017-10-12 LAB — BASIC METABOLIC PANEL
Anion gap: 10 (ref 5–15)
BUN: 14 mg/dL (ref 6–20)
CALCIUM: 10.5 mg/dL — AB (ref 8.9–10.3)
CHLORIDE: 91 mmol/L — AB (ref 98–111)
CO2: 32 mmol/L (ref 22–32)
Creatinine, Ser: 0.62 mg/dL (ref 0.61–1.24)
GFR calc Af Amer: >60 mL/min (ref >60)
GLUCOSE: 97 mg/dL (ref 70–99)
POTASSIUM: 3.9 mmol/L (ref 3.5–5.1)
Sodium: 133 mmol/L — ABNORMAL LOW (ref 135–145)

## 2017-10-12 LAB — GRAM STAIN

## 2017-10-12 LAB — CBC
HEMATOCRIT: 39.6 % (ref 39.0–52.0)
HEMOGLOBIN: 13.3 g/dL (ref 13.0–17.0)
MCH: 30.6 pg (ref 26.0–34.0)
MCHC: 33.6 g/dL (ref 30.0–36.0)
MCV: 91.2 fL (ref 78.0–100.0)
Platelets: 134 10*3/uL — ABNORMAL LOW (ref 150–400)
RBC: 4.34 MIL/uL (ref 4.22–5.81)
RDW: 16.4 % — AB (ref 11.5–15.5)
WBC: 5.9 10*3/uL (ref 4.0–10.5)

## 2017-10-12 LAB — PTH, INTACT AND CALCIUM
Calcium, Total (PTH): 10.3 mg/dL — ABNORMAL HIGH (ref 8.6–10.2)
PTH: 5 pg/mL — AB (ref 15–65)

## 2017-10-12 LAB — PH, BODY FLUID: pH, Body Fluid: 7.4

## 2017-10-12 LAB — AFP TUMOR MARKER: AFP, Serum, Tumor Marker: 39.2 ng/mL — ABNORMAL HIGH (ref 0.0–8.3)

## 2017-10-12 MED ORDER — GADOBUTROL 1 MMOL/ML IV SOLN
10.0000 mL | Freq: Once | INTRAVENOUS | Status: AC | PRN
  Administered 2017-10-12: 9 mL via INTRAVENOUS

## 2017-10-12 MED ORDER — LACTULOSE 10 GM/15ML PO SOLN
30.0000 g | Freq: Three times a day (TID) | ORAL | Status: DC
  Administered 2017-10-12 – 2017-10-14 (×3): 30 g via ORAL
  Filled 2017-10-12 (×4): qty 45

## 2017-10-12 MED ORDER — POLYETHYLENE GLYCOL 3350 17 G PO PACK
17.0000 g | PACK | Freq: Every day | ORAL | Status: DC
  Administered 2017-10-12 – 2017-10-14 (×2): 17 g via ORAL
  Filled 2017-10-12 (×2): qty 1

## 2017-10-12 MED ORDER — FUROSEMIDE 10 MG/ML IJ SOLN
40.0000 mg | Freq: Three times a day (TID) | INTRAMUSCULAR | Status: DC
  Administered 2017-10-13: 40 mg via INTRAVENOUS
  Filled 2017-10-12: qty 4

## 2017-10-12 MED ORDER — FUROSEMIDE 10 MG/ML IJ SOLN
40.0000 mg | Freq: Three times a day (TID) | INTRAMUSCULAR | Status: AC
  Administered 2017-10-12 (×2): 40 mg via INTRAVENOUS
  Filled 2017-10-12 (×3): qty 4

## 2017-10-12 MED ORDER — FUROSEMIDE 40 MG PO TABS
40.0000 mg | ORAL_TABLET | Freq: Two times a day (BID) | ORAL | Status: DC
  Administered 2017-10-12: 40 mg via ORAL
  Filled 2017-10-12: qty 1

## 2017-10-12 MED ORDER — LACTULOSE 10 GM/15ML PO SOLN: 20.0000 g | Freq: Three times a day (TID) | ORAL | Status: DC

## 2017-10-12 NOTE — Progress Notes (Signed)
Initial Nutrition Assessment  INTERVENTION:   Continue Ensure Enlive po BID, each supplement provides 350 kcal and 20 grams of protein  NUTRITION DIAGNOSIS:   Increased nutrient needs related to (liver lesions, ascites, hepatitis C) as evidenced by estimated needs.  GOAL:   Patient will meet greater than or equal to 90% of their needs  MONITOR:   PO intake, Supplement acceptance, Labs, Weight trends, I & O's  REASON FOR ASSESSMENT:   Malnutrition Screening Tool    ASSESSMENT:   62 y.o. male past medical history of hepatitis C, cirrhosis, BPH who presents for abdominal pain. He was recently hospitalized from 10/05/17-10/07/17 for the same. He had extensive ascites and required a paracentesis. He states his abdominal distension and pain recurred soon after despite taking all prescribed medications..   Patient was taken to have MRI, not able to get nutrition history at this time. Pt currently consuming 75-80% of meals. Pt  Is drinking Ensure supplements, none yet today given procedure.   Patient has lost 26 lb since 8/30 per weight records. Pt has had paracentesis procedures during that time for ascites. This may reflect loss of fluid.  Medications: IV Lasix TID Labs reviewed: Low Na  NUTRITION - FOCUSED PHYSICAL EXAM:  Unable to perform.  Diet Order:   Diet Order            Diet 2 gram sodium Room service appropriate? Yes; Fluid consistency: Thin; Fluid restriction: 1200 mL Fluid  Diet effective now              EDUCATION NEEDS:   No education needs have been identified at this time  Skin:  Skin Assessment: Reviewed RN Assessment  Last BM:  9/8  Height:   Ht Readings from Last 1 Encounters:  10/10/17 6' (1.829 m)    Weight:   Wt Readings from Last 1 Encounters:  10/10/17 88.9 kg    Ideal Body Weight:  80.9 kg  BMI:  Body mass index is 26.58 kg/m.  Estimated Nutritional Needs:   Kcal:  2200-2400  Protein:  110-120g  Fluid:  per MD  Clayton Bibles, MS, RD, LDN [place] Dietitian Pager: [phone] After Hours Pager: [phone]

## 2017-10-12 NOTE — Care Management Note (Signed)
Case Management Note  Patient Details  Name: [name] MRN: [id number] Date of Birth: [birth year]  Subjective/Objective:                  abdominalpainanddistention due to hep c and cirrhosis,   Action/Plan:  Will follow for progressionand care. Expected Discharge Date:                  Expected Discharge Plan:  Home/Self Care  In-House Referral:     Discharge planning Services  CM Consult  Post Acute Care Choice:    Choice offered to:     DME Arranged:    DME Agency:     HH Arranged:    HH Agency:     Status of Service:  In process, will continue to follow  If discussed at Long Length of Stay Meetings, dates discussed:    Additional Comments:  Leeroy Cha, RN 10/12/2017, 12:12 PM
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
## 2017-10-13 ENCOUNTER — Inpatient Hospital Stay (HOSPITAL_COMMUNITY): Payer: Medicare HMO

## 2017-10-13 DIAGNOSIS — R160 Hepatomegaly, not elsewhere classified: Secondary | ICD-10-CM

## 2017-10-13 LAB — PROTIME-INR
INR: 1.26
PROTHROMBIN TIME: 15.7 s — AB (ref 11.4–15.2)

## 2017-10-13 LAB — COMPREHENSIVE METABOLIC PANEL
ALBUMIN: 2.5 g/dL — AB (ref 3.5–5.0)
ALK PHOS: 287 U/L — AB (ref 38–126)
ALT: 86 U/L — ABNORMAL HIGH (ref 0–44)
ANION GAP: 11 (ref 5–15)
AST: 236 U/L — ABNORMAL HIGH (ref 15–41)
BILIRUBIN TOTAL: 3.4 mg/dL — AB (ref 0.3–1.2)
BUN: 12 mg/dL (ref 6–20)
CALCIUM: 10.3 mg/dL (ref 8.9–10.3)
CO2: 32 mmol/L (ref 22–32)
Chloride: 88 mmol/L — ABNORMAL LOW (ref 98–111)
Creatinine, Ser: 0.56 mg/dL — ABNORMAL LOW (ref 0.61–1.24)
Glucose, Bld: 101 mg/dL — ABNORMAL HIGH (ref 70–99)
Potassium: 3.2 mmol/L — ABNORMAL LOW (ref 3.5–5.1)
Sodium: 131 mmol/L — ABNORMAL LOW (ref 135–145)
TOTAL PROTEIN: 7 g/dL (ref 6.5–8.1)

## 2017-10-13 LAB — CANCER ANTIGEN 19-9: CA 19-9: 682 U/mL — ABNORMAL HIGH (ref 0–35)

## 2017-10-13 MED ORDER — MIDAZOLAM HCL 2 MG/2ML IJ SOLN
INTRAMUSCULAR | Status: AC | PRN
  Administered 2017-10-13 (×2): 1 mg via INTRAVENOUS

## 2017-10-13 MED ORDER — FUROSEMIDE 10 MG/ML IJ SOLN
40.0000 mg | Freq: Two times a day (BID) | INTRAMUSCULAR | Status: DC
  Administered 2017-10-13 – 2017-10-14 (×2): 40 mg via INTRAVENOUS
  Filled 2017-10-13 (×2): qty 4

## 2017-10-13 MED ORDER — LIDOCAINE HCL 1 % IJ SOLN
INTRAMUSCULAR | Status: AC
  Filled 2017-10-13: qty 20

## 2017-10-13 MED ORDER — LIDOCAINE HCL (PF) 1 % IJ SOLN
INTRAMUSCULAR | Status: AC | PRN
  Administered 2017-10-13: 10 mL

## 2017-10-13 MED ORDER — MORPHINE SULFATE (PF) 2 MG/ML IV SOLN
2.0000 mg | INTRAVENOUS | Status: DC | PRN
  Administered 2017-10-13: 2 mg via INTRAVENOUS
  Filled 2017-10-13: qty 1

## 2017-10-13 MED ORDER — OXYCODONE HCL 5 MG PO TABS: 5.0000 mg | ORAL_TABLET | ORAL | Status: DC | PRN

## 2017-10-13 MED ORDER — POTASSIUM CHLORIDE CRYS ER 20 MEQ PO TBCR
40.0000 meq | EXTENDED_RELEASE_TABLET | Freq: Once | ORAL | Status: AC
  Administered 2017-10-13: 40 meq via ORAL
  Filled 2017-10-13: qty 2

## 2017-10-13 MED ORDER — MIDAZOLAM HCL 2 MG/2ML IJ SOLN
INTRAMUSCULAR | Status: AC
  Filled 2017-10-13: qty 4

## 2017-10-13 MED ORDER — GELATIN ABSORBABLE 12-7 MM EX MISC
CUTANEOUS | Status: AC
  Filled 2017-10-13: qty 1

## 2017-10-13 MED ORDER — POTASSIUM CHLORIDE 10 MEQ/100ML IV SOLN
10.0000 meq | INTRAVENOUS | Status: AC
  Administered 2017-10-13 (×4): 10 meq via INTRAVENOUS
  Filled 2017-10-13 (×4): qty 100

## 2017-10-13 MED ORDER — FENTANYL CITRATE (PF) 100 MCG/2ML IJ SOLN
INTRAMUSCULAR | Status: AC
  Filled 2017-10-13: qty 2

## 2017-10-13 MED ORDER — FENTANYL CITRATE (PF) 100 MCG/2ML IJ SOLN
INTRAMUSCULAR | Status: AC | PRN
  Administered 2017-10-13 (×2): 50 ug via INTRAVENOUS

## 2017-10-13 NOTE — Progress Notes (Signed)
PROGRESS NOTE    [name]   IPJ:[id number]  DOB: [birth year]  DOA: 10/10/2017 PCP: Marton Redwood, MD   Brief Narrative:  [name] is an 62 y.o.malepast medical history of hepatitis C, cirrhosis, BPH who presents for abdominal pain. He was recently hospitalized from 10/05/17-10/07/17 for the same. He had extensive ascites and required a paracentesis.    He states he was not making much urine despite taking all prescribed medications and also has been severely constipated leading to recurrence of abdominal swelling and pain.   Subjective: He is urinating much more now and had a very large BM yesterday and thus his abdominal pain is improving. No nausea or vomiting. Charles have Liver biopsy today.  Assessment & Plan:   Principal Problem:   Abdominal pain - most likely due to abdominal distension as a result of ascites  - 2.1 L fluid removed on 9/8 by IR - Albumin given - cont diuretics- changed Lasix to IV to help diurese ar oral was ineffective (? Bowel edema and absorption) -  I and O not being measured accurately- Charles cut back on Lasix to BID to prevent dehydration - ascitic fluid not consistent with SBP- suspect pain is mainly from abdominal distension- noted to be improving as distension resolves - as he is failing outpt diuretics, he needs much higher doses of diuretics and close f/u when discharged to prevent another paracentesis  Active Problems:    Chronic hepatitis C without hepatic coma  (has been adequately treated)   Decompensated hepatic cirrhosis    Liver masses - MRI ordered to assess for hepatocellular CA which reveals innumerable liver mets and bone mets - AFP tumor marker elevated at 39.2 - CA- 19-9- elevated at 682 - after extensive conversation with patient, a liver biopsy was ordered and Charles be done today - no h/o colon cancer or any other cancer in the past  Constipation - cont Lactulose- increased dose yesterday and added a one time Miralax  which has helped him have a BM     Lactic acidosis - improved    Hypercalcemia - possibly due to malignancy and bone mets - iPTH is low at 5  - PTH related peptide ordered and pending    Thrombocytopenia  - likely related to cirrhosis   DVT prophylaxis: Lovenox Code Status: Full code Family Communication:  Disposition Plan: home when stable   Consultants:   IR Procedures:   paracentesis Antimicrobials:  Anti-infectives (From admission, onward)   None       Objective: Vitals:   10/12/17 0426 10/12/17 1732 10/12/17 2151 10/13/17 0533  BP: 127/69 123/75 116/70 119/72  Pulse: 86 85 85 87  Resp: 16 18 (!) 21 (!) 24  Temp: 98.9 F (37.2 C)  98.4 F (36.9 C) 98.4 F (36.9 C)  TempSrc: Oral  Oral Oral  SpO2: 95% 99% 97% 95%  Weight:      Height:        Intake/Output Summary (Last 24 hours) at 10/13/2017 1217 Last data filed at 10/13/2017 7341 Gross per 24 hour  Intake 90 ml  Output 1120 ml  Net -1030 ml   Filed Weights   10/10/17 1731 10/10/17 2233 10/10/17 2359  Weight: 95.2 kg 90.5 kg 88.9 kg    Examination: General exam: Appears comfortable -  HEENT: PERRLA- oral mucosa moist, no sclera icterus or thrush Respiratory system: Clear to auscultation. Respiratory effort normal. Cardiovascular system: S1 & S2 heard, RRR.   Gastrointestinal system: Abdomen soft,  Mildly tender across lower and mid abdomen with   distension and fluid wave noted from ascites. Normal bowel sound. No organomegaly Central nervous system: Alert and oriented. No focal neurological deficits. Extremities: No cyanosis, clubbing or edema Skin: No rashes or ulcers Psychiatry:  Mood & affect appropriate.     Data Reviewed: I have personally reviewed following labs and imaging studies  CBC: Recent Labs  Lab 10/10/17 1748 10/11/17 0319 10/12/17 0523  WBC 9.5 7.3 5.9  HGB 16.0 13.4 13.3  HCT 46.4 39.0 39.6  MCV 89.9 88.8 91.2  PLT 148* 132* 254*   Basic Metabolic Panel: Recent  Labs  Lab 10/07/17 0531 10/10/17 1748 10/11/17 0004 10/11/17 0319 10/12/17 0523 10/13/17 0528  NA 132* 131*  --  133* 133* 131*  K 3.8 3.5  --  3.7 3.9 3.2*  CL 95* 88*  --  93* 91* 88*  CO2 26 26  --  29 32 32  GLUCOSE 89 102*  --  102* 97 101*  BUN 14 15  --  18 14 12   CREATININE 0.47* 0.72  --  0.58* 0.62 0.56*  CALCIUM 10.4* 10.8* 10.3* 10.8* 10.5* 10.3  MG  --  1.9  --   --   --   --    GFR: Estimated Creatinine Clearance: 107.8 mL/min (A) (by C-G formula based on SCr of 0.56 mg/dL (L)). Liver Function Tests: Recent Labs  Lab 10/10/17 1748 10/11/17 0319 10/13/17 0528  AST 223* 175* 236*  ALT 80* 65* 86*  ALKPHOS 317* 251* 287*  BILITOT 3.0* 2.6* 3.4*  PROT 7.9 6.9 7.0  ALBUMIN 2.6* 2.6* 2.5*   Recent Labs  Lab 10/10/17 1748  LIPASE 34   No results for input(s): AMMONIA in the last 168 hours. Coagulation Profile: Recent Labs  Lab 10/13/17 0528  INR 1.26   Cardiac Enzymes: No results for input(s): CKTOTAL, CKMB, CKMBINDEX, TROPONINI in the last 168 hours. BNP (last 3 results) No results for input(s): PROBNP in the last 8760 hours. HbA1C: No results for input(s): HGBA1C in the last 72 hours. CBG: No results for input(s): GLUCAP in the last 168 hours. Lipid Profile: No results for input(s): CHOL, HDL, LDLCALC, TRIG, CHOLHDL, LDLDIRECT in the last 72 hours. Thyroid Function Tests: No results for input(s): TSH, T4TOTAL, FREET4, T3FREE, THYROIDAB in the last 72 hours. Anemia Panel: No results for input(s): VITAMINB12, FOLATE, FERRITIN, TIBC, IRON, RETICCTPCT in the last 72 hours. Urine analysis:    Component Value Date/Time   COLORURINE AMBER (A) 10/10/2017 1735   APPEARANCEUR CLEAR 10/10/2017 1735   LABSPEC 1.043 (H) 10/10/2017 1735   PHURINE 6.0 10/10/2017 1735   GLUCOSEU NEGATIVE 10/10/2017 1735   HGBUR SMALL (A) 10/10/2017 1735   BILIRUBINUR NEGATIVE 10/10/2017 1735   KETONESUR 5 (A) 10/10/2017 1735   PROTEINUR NEGATIVE 10/10/2017 1735    UROBILINOGEN 0.2 06/26/2013 1316   NITRITE NEGATIVE 10/10/2017 1735   LEUKOCYTESUR NEGATIVE 10/10/2017 1735   Sepsis Labs: @LABRCNTIP (procalcitonin:4,lacticidven:4) ) Recent Results (from the past 240 hour(s))  Urine culture     Status: Abnormal   Collection Time: 10/06/17  6:39 AM  Result Value Ref Range Status   Specimen Description   Final    URINE, CLEAN CATCH Performed at [facility], [place] [address] [place], [place] [address]    Special Requests   Final    NONE Performed at [facility], [place] [address], [place], [place] [address]    Culture (A)  Final    <  10,000 COLONIES/mL INSIGNIFICANT GROWTH Performed at [facility], [place] [address] [place], [place] [address]    Report Status 10/07/2017 FINAL  Final  Culture, body fluid-bottle     Status: None   Collection Time: 10/06/17 10:03 AM  Result Value Ref Range Status   Specimen Description PERITONEAL  Final   Special Requests NONE  Final   Culture   Final    NO GROWTH 5 DAYS Performed at [facility] [address] [place], [place] [address]    Report Status 10/11/2017 FINAL  Final  Gram stain     Status: None   Collection Time: 10/06/17 10:03 AM  Result Value Ref Range Status   Specimen Description PERITONEAL  Final   Special Requests NONE  Final   Gram Stain   Final    NO WBC SEEN NO ORGANISMS SEEN Performed at [facility], [place] [address] [place], [place] [address]    Report Status 10/06/2017 FINAL  Final  Culture, body fluid-bottle     Status: None (Preliminary result)   Collection Time: 10/11/17  1:01 PM  Result Value Ref Range Status   Specimen Description PERITONEAL  Final   Special Requests NONE  Final   Culture   Final    NO GROWTH 2 DAYS Performed at [facility] [address], [place], [place] [address]    Report Status PENDING  Incomplete  Gram stain     Status: None   Collection Time: 10/11/17  1:01 PM  Result Value Ref Range  Status   Specimen Description PERITONEAL  Final   Special Requests NONE  Final   Gram Stain   Final    RARE WBC PRESENT, PREDOMINANTLY MONONUCLEAR NO ORGANISMS SEEN Performed at [facility], [address], [place], [place] [address]    Report Status 10/12/2017 FINAL  Final         Radiology Studies: Mr Liver W Wo Contrast  Result Date: 10/12/2017 CLINICAL DATA:  Inpatient. Cirrhosis. Indeterminate low-attenuation liver lesions on CT. Paracentesis 1 day prior. AFP level pending. EXAM: MRI ABDOMEN WITHOUT AND WITH CONTRAST TECHNIQUE: Multiplanar multisequence MR imaging of the abdomen was performed both before and after the administration of intravenous contrast. CONTRAST:  9 cc Gadavist IV. COMPARISON:  10/11/2017 paracentesis.  10/10/2017 CT abdomen/pelvis. FINDINGS: Lower chest: Subsegmental scarring versus atelectasis at the dependent left lung base. Enlarged 1.2 cm anterior pericardiophrenic node (series 905/image 25). Hepatobiliary: Liver surface is diffusely irregular, compatible with hepatic cirrhosis. No hepatic steatosis. The liver parenchyma is largely replaced by innumerable confluent hyperenhancing liver masses, each demonstrating T2 hyperintensity, T1 hypointensity, restricted diffusion and targetoid hyperenhancement. Representative 2.7 x 2.5 cm segment 6 right liver lobe mass (series 901/image 56). Representative 2.5 x 1.9 cm segment 3 left liver lobe mass (series 901/image 46). Representative far inferior right liver lobe 2.4 x 2.3 cm mass (series 901/image 88). No cholelithiasis. Mild diffuse gallbladder wall thickening. No biliary ductal dilatation. Common bile duct diameter 5 mm. No choledocholithiasis. Pancreas: No pancreatic mass or duct dilation.  No pancreas divisum. Spleen: Moderate splenomegaly (craniocaudal splenic length 15.5 cm). No splenic mass. Adrenals/Urinary Tract: Normal adrenals. No hydronephrosis. Normal kidneys with no renal mass. Stomach/Bowel: Normal  non-distended stomach. Visualized small and large bowel is normal caliber, with no bowel wall thickening. Vascular/Lymphatic: Atherosclerotic nonaneurysmal abdominal aorta. Patent portal, splenic, hepatic and renal veins. Moderate gastroesophageal and paraumbilical varices. Enlarged 1.5 cm porta hepatis node (series 905/image 63). Enlarged 1.5  cm gastrohepatic ligament node (series 905/image 50). Other: Small to moderate volume abdominal ascites. Musculoskeletal: Several enhancing vertebral lesions throughout the visualized spine, largest 1.9 cm (series 901/image 58), probably within the posterior L1 vertebral body. Several enhancing bilateral rib lesions, for example within a posterior lower right rib (series 901/image 37) and within a lateral left rib (series 901/image 27). IMPRESSION: 1. Hepatic cirrhosis. Liver parenchyma largely replaced by innumerable confluent hyperenhancing liver masses, compatible with widespread liver malignancy. 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 The targetoid enhancement and low T1 attenuation of these liver masses is also compatible with widespread liver metastases from a non-hepatic primary malignancy. 2. Nonspecific porta hepatis, gastrohepatic ligament and pericardiophrenic adenopathy, cannot exclude nodal metastases. 3. Numerous enhancing osseous lesions in the visualized spine and bilateral lower ribs, compatible with widespread osseous metastatic disease. 4. Secondary findings of portal hypertension, including small to moderate volume ascites, moderate gastroesophageal and paraesophageal varices, and moderate splenomegaly. 5.  Aortic Atherosclerosis (ICD10-I70.0). Electronically Signed   By: Ilona Sorrel M.D.   On: 10/12/2017 12:52   US Paracentesis  Result Date: 10/11/2017 INDICATION: Hepatitis-C, cirrhosis, liver lesions, and ascites. Request for diagnostic and therapeutic paracentesis. EXAM: ULTRASOUND GUIDED PARACENTESIS MEDICATIONS: %  lidocaine 10 mL COMPLICATIONS: None immediate. PROCEDURE: Informed written consent was obtained from the patient after a discussion of the risks, benefits and alternatives to treatment. A timeout was performed prior to the initiation of the procedure. Initial ultrasound scanning demonstrates a moderate amount of ascites within the right lower abdominal quadrant. The right lower abdomen was prepped and draped in the usual sterile fashion. 1% lidocaine with epinephrine was used for local anesthesia. Following this, a 19 gauge, 7-cm, Yueh catheter was introduced. An ultrasound image was saved for documentation purposes. The paracentesis was performed. The catheter was removed and a dressing was applied. The patient tolerated the procedure well without immediate post procedural complication. FINDINGS: A total of approximately 2.1 L of clear yellow fluid was removed. Samples were sent to the laboratory as requested by the clinical team. IMPRESSION: Successful ultrasound-guided paracentesis yielding 2.1 liters of peritoneal fluid. Read by: Gareth Eagle, PA-C Electronically Signed   By: Corrie Mckusick D.O.   On: 10/11/2017 12:51      Scheduled Meds: . feeding supplement (ENSURE ENLIVE)  237 mL Oral BID BM  . furosemide  40 mg Intravenous Q8H  . lactulose  30 g Oral TID  . polyethylene glycol  17 g Oral Daily  . potassium chloride  40 mEq Oral Once  . spironolactone  100 mg Oral Daily  . tamsulosin  0.4 mg Oral QPC breakfast   Continuous Infusions:   LOS: 1 day    Time spent in minutes: 35    Debbe Odea, MD [facility] Pager: [email or web address] Password [facility] 10/13/2017, 12:17 PM
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
## 2017-10-14 DIAGNOSIS — R109 Unspecified abdominal pain: Secondary | ICD-10-CM

## 2017-10-14 DIAGNOSIS — D696 Thrombocytopenia, unspecified: Secondary | ICD-10-CM

## 2017-10-14 DIAGNOSIS — K7290 Hepatic failure, unspecified without coma: Secondary | ICD-10-CM

## 2017-10-14 DIAGNOSIS — B182 Chronic viral hepatitis C: Secondary | ICD-10-CM

## 2017-10-14 DIAGNOSIS — R188 Other ascites: Secondary | ICD-10-CM

## 2017-10-14 DIAGNOSIS — E872 Acidosis: Secondary | ICD-10-CM

## 2017-10-14 DIAGNOSIS — K769 Liver disease, unspecified: Secondary | ICD-10-CM

## 2017-10-14 LAB — CBC WITH DIFFERENTIAL/PLATELET
Basophils Absolute: 0 10*3/uL (ref 0.0–0.1)
Basophils Relative: 0 %
Eosinophils Absolute: 0 10*3/uL (ref 0.0–0.7)
Eosinophils Relative: 1 %
HEMATOCRIT: 41.4 % (ref 39.0–52.0)
HEMOGLOBIN: 14.1 g/dL (ref 13.0–17.0)
LYMPHS ABS: 0.6 10*3/uL — AB (ref 0.7–4.0)
Lymphocytes Relative: 12 %
MCH: 30.7 pg (ref 26.0–34.0)
MCHC: 34.1 g/dL (ref 30.0–36.0)
MCV: 90.2 fL (ref 78.0–100.0)
MONOS PCT: 11 %
Monocytes Absolute: 0.6 10*3/uL (ref 0.1–1.0)
NEUTROS ABS: 4.2 10*3/uL (ref 1.7–7.7)
NEUTROS PCT: 76 %
Platelets: 105 10*3/uL — ABNORMAL LOW (ref 150–400)
RBC: 4.59 MIL/uL (ref 4.22–5.81)
RDW: 16.6 % — ABNORMAL HIGH (ref 11.5–15.5)
WBC: 5.4 10*3/uL (ref 4.0–10.5)

## 2017-10-14 LAB — COMPREHENSIVE METABOLIC PANEL
ALK PHOS: 252 U/L — AB (ref 38–126)
ALT: 80 U/L — ABNORMAL HIGH (ref 0–44)
ANION GAP: 12 (ref 5–15)
AST: 218 U/L — ABNORMAL HIGH (ref 15–41)
Albumin: 2.5 g/dL — ABNORMAL LOW (ref 3.5–5.0)
BILIRUBIN TOTAL: 4.1 mg/dL — AB (ref 0.3–1.2)
BUN: 13 mg/dL (ref 6–20)
CALCIUM: 10.6 mg/dL — AB (ref 8.9–10.3)
CO2: 31 mmol/L (ref 22–32)
Chloride: 90 mmol/L — ABNORMAL LOW (ref 98–111)
Creatinine, Ser: 0.4 mg/dL — ABNORMAL LOW (ref 0.61–1.24)
Glucose, Bld: 100 mg/dL — ABNORMAL HIGH (ref 70–99)
Potassium: 4.1 mmol/L (ref 3.5–5.1)
Sodium: 133 mmol/L — ABNORMAL LOW (ref 135–145)
TOTAL PROTEIN: 7 g/dL (ref 6.5–8.1)

## 2017-10-14 LAB — LIPASE, FLUID: Lipase-Fluid: 24 U/L

## 2017-10-14 LAB — PHOSPHORUS: PHOSPHORUS: 2.1 mg/dL — AB (ref 2.5–4.6)

## 2017-10-14 LAB — MAGNESIUM: Magnesium: 1.9 mg/dL (ref 1.7–2.4)

## 2017-10-14 MED ORDER — ENSURE ENLIVE PO LIQD: 237.0000 mL | Freq: Two times a day (BID) | ORAL | 12 refills | Status: DC

## 2017-10-14 MED ORDER — POTASSIUM CHLORIDE CRYS ER 20 MEQ PO TBCR: 20.0000 meq | EXTENDED_RELEASE_TABLET | Freq: Two times a day (BID) | ORAL | 0 refills | Status: AC

## 2017-10-14 MED ORDER — POTASSIUM CHLORIDE CRYS ER 20 MEQ PO TBCR
20.0000 meq | EXTENDED_RELEASE_TABLET | Freq: Two times a day (BID) | ORAL | Status: DC
  Administered 2017-10-14: 20 meq via ORAL
  Filled 2017-10-14: qty 1

## 2017-10-14 MED ORDER — POLYETHYLENE GLYCOL 3350 17 G PO PACK: 17.0000 g | PACK | Freq: Every day | ORAL | 0 refills | Status: AC

## 2017-10-14 MED ORDER — LACTULOSE 10 GM/15ML PO SOLN: 30.0000 g | Freq: Three times a day (TID) | ORAL | 0 refills | Status: AC

## 2017-10-14 MED ORDER — K PHOS MONO-SOD PHOS DI & MONO 155-852-130 MG PO TABS
500.0000 mg | ORAL_TABLET | Freq: Two times a day (BID) | ORAL | Status: DC
  Administered 2017-10-14: 500 mg via ORAL
  Filled 2017-10-14: qty 2

## 2017-10-14 MED ORDER — OXYCODONE HCL 5 MG PO TABS: 5.0000 mg | ORAL_TABLET | Freq: Four times a day (QID) | ORAL | 0 refills | Status: AC | PRN

## 2017-10-14 MED ORDER — FUROSEMIDE 40 MG PO TABS: 80.0000 mg | ORAL_TABLET | Freq: Two times a day (BID) | ORAL | Status: DC

## 2017-10-14 MED ORDER — FUROSEMIDE 80 MG PO TABS: 80.0000 mg | ORAL_TABLET | Freq: Two times a day (BID) | ORAL | 0 refills | Status: AC

## 2017-10-14 NOTE — Discharge Summary (Signed)
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  Result Value Ref Range Status   Specimen Description   Final    URINE, CLEAN CATCH Performed at [facility], [place] [address], [place], [place] [address]    Special Requests   Final    NONE Performed at [facility], [place] [address] [place], [place] [address]    Culture (A)  Final    <10,000 COLONIES/mL INSIGNIFICANT GROWTH Performed at [facility] [address], [place], [place] [address]    Report Status 10/07/2017 FINAL  Final  Culture, body fluid-bottle     Status: None   Collection Time: 10/06/17 10:03 AM  Result Value Ref Range Status   Specimen Description PERITONEAL  Final   Special Requests NONE  Final   Culture   Final    NO GROWTH 5 DAYS Performed at [facility] [address] [place], [place] [address]    Report Status 10/11/2017 FINAL  Final  Gram stain     Status: None   Collection Time: 10/06/17 10:03 AM  Result Value Ref Range Status   Specimen Description PERITONEAL  Final   Special Requests NONE  Final   Gram Stain   Final    NO WBC SEEN NO ORGANISMS  SEEN Performed at [facility], [place] [address] [place], [place] [address]    Report Status 10/06/2017 FINAL  Final  Culture, body fluid-bottle     Status: None (Preliminary result)   Collection Time: 10/11/17  1:01 PM  Result Value Ref Range Status   Specimen Description PERITONEAL  Final   Special Requests NONE  Final   Culture   Final    NO GROWTH 3 DAYS Performed at [facility] [address] [place],  [address]    Report Status PENDING  Incomplete  Gram stain     Status: None   Collection Time: 10/11/17  1:01 PM  Result Value Ref Range Status   Specimen Description PERITONEAL  Final   Special Requests NONE  Final   Gram Stain   Final    RARE WBC PRESENT, PREDOMINANTLY MONONUCLEAR NO ORGANISMS SEEN Performed at  [facility], [place] [address], [place], [place] [address]    Report Status 10/12/2017 FINAL  Final    Labs: BNP (last 3 results) Recent Labs    10/05/17 2232  BNP 60.4   Basic Metabolic Panel: Recent Labs  Lab 10/10/17 1748 10/11/17 0004 10/11/17 0319 10/12/17 0523 10/13/17 0528 10/14/17 0856  NA 131*  --  133* 133* 131* 133*  K 3.5  --  3.7 3.9 3.2* 4.1  CL 88*  --  93* 91* 88* 90*  CO2 26  --  29 32 32 31  GLUCOSE 102*  --  102* 97 101* 100*  BUN 15  --  18 14 12 13   CREATININE 0.72  --  0.58* 0.62 0.56* 0.40*  CALCIUM 10.8* 10.3* 10.8* 10.5* 10.3 10.6*  MG 1.9  --   --   --   --  1.9  PHOS  --   --   --   --   --  2.1*   Liver Function Tests: Recent Labs  Lab 10/10/17 1748 10/11/17 0319 10/13/17 0528 10/14/17 0856  AST 223* 175* 236* 218*  ALT 80* 65* 86* 80*  ALKPHOS 317* 251* 287* 252*  BILITOT 3.0* 2.6* 3.4* 4.1*  PROT 7.9 6.9 7.0 7.0  ALBUMIN 2.6* 2.6* 2.5* 2.5*   Recent Labs  Lab 10/10/17 1748  LIPASE 34   No results for input(s): AMMONIA in the last 168 hours. CBC: Recent Labs  Lab 10/10/17 1748 10/11/17 0319 10/12/17 0523 10/14/17 0856  WBC 9.5 7.3 5.9 5.4  NEUTROABS  --   --   --  4.2   HGB 16.0 13.4 13.3 14.1  HCT 46.4 39.0 39.6 41.4  MCV 89.9 88.8 91.2 90.2  PLT 148* 132* 134* 105*   Cardiac Enzymes: No results for input(s): CKTOTAL, CKMB, CKMBINDEX, TROPONINI in the last 168 hours. BNP: Invalid input(s): POCBNP CBG: No results for input(s): GLUCAP in the last 168 hours. D-Dimer No results for input(s): DDIMER in the last 72 hours. Hgb A1c No results for input(s): HGBA1C in the last 72 hours. Lipid Profile No results for input(s): CHOL, HDL, LDLCALC, TRIG, CHOLHDL, LDLDIRECT in the last 72 hours. Thyroid function studies No results for input(s): TSH, T4TOTAL, T3FREE, THYROIDAB in the last 72 hours.  Invalid input(s): FREET3 Anemia work up No results for input(s): VITAMINB12, FOLATE, FERRITIN, TIBC, IRON, RETICCTPCT in the last 72 hours. Urinalysis    Component Value Date/Time   COLORURINE AMBER (A) 10/10/2017 1735   APPEARANCEUR CLEAR 10/10/2017 1735   LABSPEC 1.043 (H) 10/10/2017 1735   PHURINE 6.0 10/10/2017 1735   GLUCOSEU NEGATIVE 10/10/2017 1735   HGBUR SMALL (A) 10/10/2017 1735   BILIRUBINUR NEGATIVE 10/10/2017 1735   KETONESUR 5 (A) 10/10/2017 1735   PROTEINUR NEGATIVE 10/10/2017 1735   UROBILINOGEN 0.2 06/26/2013 1316   NITRITE NEGATIVE 10/10/2017 1735   LEUKOCYTESUR NEGATIVE 10/10/2017 1735   Sepsis Labs Invalid input(s): PROCALCITONIN,  WBC,  LACTICIDVEN Microbiology Recent Results (from the past 240 hour(s))  Urine culture     Status: Abnormal   Collection Time: 10/06/17  6:39 AM  Result Value Ref Range Status   Specimen Description   Final    URINE, CLEAN CATCH Performed at [facility], [place] [address], [place], [place] [address]    Special Requests   Final    NONE Performed at [facility], [place] [address] [place], [place] [address]    Culture (A)  Final    <10,000  COLONIES/mL INSIGNIFICANT GROWTH Performed at [facility] [address], [place], [place] [address]    Report  Status 10/07/2017 FINAL  Final  Culture, body fluid-bottle     Status: None   Collection Time: 10/06/17 10:03 AM  Result Value Ref Range Status   Specimen Description PERITONEAL  Final   Special Requests NONE  Final   Culture   Final    NO GROWTH 5 DAYS Performed at [facility] [address] [place], [place] [address]    Report Status 10/11/2017 FINAL  Final  Gram stain     Status: None   Collection Time: 10/06/17 10:03 AM  Result Value Ref Range Status   Specimen Description PERITONEAL  Final   Special Requests NONE  Final   Gram Stain   Final    NO WBC SEEN NO ORGANISMS SEEN Performed at [facility], [place] [address] [place], [place] [address]    Report Status 10/06/2017 FINAL  Final  Culture, body fluid-bottle     Status: None (Preliminary result)   Collection Time: 10/11/17  1:01 PM  Result Value Ref Range Status   Specimen Description PERITONEAL  Final   Special Requests NONE  Final   Culture   Final    NO GROWTH 3 DAYS Performed at [facility] [address] [place], [place] [address]    Report Status PENDING  Incomplete  Gram stain     Status: None   Collection Time: 10/11/17  1:01 PM  Result Value Ref Range Status   Specimen Description PERITONEAL  Final   Special Requests NONE  Final   Gram Stain   Final    RARE WBC PRESENT, PREDOMINANTLY MONONUCLEAR NO ORGANISMS SEEN Performed at [facility], [address] [place], [place] [address]    Report Status 10/12/2017 FINAL  Final   Time coordinating discharge: 35 minutes  SIGNED:  Kerney Elbe, DO [facility] 10/14/2017, 7:18 PM Pager is on [place]  If 7PM-7AM, please contact night-coverage [email or web address] Password [email or web address]

## 2017-10-15 DIAGNOSIS — E46 Unspecified protein-calorie malnutrition: Secondary | ICD-10-CM | POA: Diagnosis not present

## 2017-10-15 DIAGNOSIS — R188 Other ascites: Secondary | ICD-10-CM | POA: Diagnosis not present

## 2017-10-15 DIAGNOSIS — K7460 Unspecified cirrhosis of liver: Secondary | ICD-10-CM | POA: Diagnosis not present

## 2017-10-15 DIAGNOSIS — K7689 Other specified diseases of liver: Secondary | ICD-10-CM | POA: Diagnosis not present

## 2017-10-15 DIAGNOSIS — M6281 Muscle weakness (generalized): Secondary | ICD-10-CM | POA: Diagnosis not present

## 2017-10-16 LAB — CULTURE, BODY FLUID-BOTTLE

## 2017-10-16 LAB — CULTURE, BODY FLUID W GRAM STAIN -BOTTLE: Culture: NO GROWTH

## 2017-10-17 LAB — PTH-RELATED PEPTIDE: PTH-related peptide: <2 pmol/L

## 2017-10-19 DIAGNOSIS — K7689 Other specified diseases of liver: Secondary | ICD-10-CM | POA: Diagnosis not present

## 2017-10-20 ENCOUNTER — Other Ambulatory Visit: Payer: Self-pay | Admitting: *Deleted

## 2017-10-20 NOTE — Patient Outreach (Signed)
[facility]) Care Management  10/20/2017  [name] [birth year] [id number]   Telephone Screen  Referral Date: 10/16/17 Referral Source:  MD referral  Referral Reason: Liver Mass, muscle weakness, protein clorie nutrition,high change of being admitted to hospital   Insurance: [place] attempt #1 unsuccessful at the number in Epic listed as both the home and mobile number No answer. [facility] RN CM left HIPAA compliant voicemail message along with CM's contact info.  Noted in Epic to have 2 admissions after presenting to ED and 1 ED visit in the last 6 months - ED for ascites, on 10/02/17, on 10/05/17 hospitalized for generalized abdominal pain and on 10/10/17 hospitalized for ascites      Social: Conditions: atherosclerosis of aorta, HTN, hyperlipidemia, CVA, neck pain , hepatitis, nicotine addiction, syncope, obesity, generalized anxiety disorder, hx of alcoholism, chronic back pain, carpal tunnel syndrome, obesity, in remission nicotine addiction, disabled due to hips and neck  Appointments: Dr Marton Redwood, Primary MD last office visit on 10/15/17    Plan: [facility] RN CM sent an unsuccessful outreach letter and scheduled this patient for another call attempt within 4 business days   [place] L. [name], RN, BSN, [place] Coordinator Office number [phone] Mobile number [phone]  Main [facility] number [phone] Fax number [phone]

## 2017-10-21 ENCOUNTER — Other Ambulatory Visit: Payer: Self-pay | Admitting: Internal Medicine

## 2017-10-21 ENCOUNTER — Other Ambulatory Visit: Payer: Self-pay | Admitting: *Deleted

## 2017-10-21 DIAGNOSIS — C801 Malignant (primary) neoplasm, unspecified: Secondary | ICD-10-CM

## 2017-10-21 DIAGNOSIS — C787 Secondary malignant neoplasm of liver and intrahepatic bile duct: Secondary | ICD-10-CM

## 2017-10-21 NOTE — Patient Outreach (Signed)
[facility]) Care Management  10/21/2017  [name] April 28, 1955 [id number]   Telephone Screen  Referral Date: 10/16/17 Referral Source:  MD referral  Referral Reason: Liver Mass, muscle weakness, protein clorie nutrition,high change of being admitted to hospital   Insurance: [place] attempt #2 unsuccessful at the number in Epic listed as both the home and mobile number No answer. [facility] RN CM left HIPAA compliant voicemail message along with CM's contact info.  Noted in Epic to have 2 admissions after presenting to ED and 1 ED visit in the last 6 months - ED for ascites, on 10/02/17, on 10/05/17 hospitalized for generalized abdominal pain and on 10/10/17 hospitalized for ascites      Social: Conditions: atherosclerosis of aorta, HTN, hyperlipidemia, CVA, neck pain , hepatitis, nicotine addiction, syncope, obesity, generalized anxiety disorder, hx of alcoholism, chronic back pain, carpal tunnel syndrome, obesity, in remission nicotine addiction, disabled due to hips and neck  Appointments: Dr Marton Redwood, Primary MD last office visit on 10/15/17    Plan: [facility] RN CM scheduled this patient for a third call attempt within 4 business days    [place] L. [name], RN, BSN, [place] Coordinator Office number [phone] Mobile number [phone]  Main [facility] number [phone] Fax number [phone]

## 2017-10-22 ENCOUNTER — Inpatient Hospital Stay (HOSPITAL_COMMUNITY): Payer: Medicare HMO

## 2017-10-22 ENCOUNTER — Emergency Department (HOSPITAL_COMMUNITY): Payer: Medicare HMO

## 2017-10-22 ENCOUNTER — Other Ambulatory Visit: Payer: Self-pay | Admitting: *Deleted

## 2017-10-22 ENCOUNTER — Encounter (HOSPITAL_COMMUNITY): Payer: Self-pay | Admitting: Obstetrics and Gynecology

## 2017-10-22 ENCOUNTER — Inpatient Hospital Stay (HOSPITAL_COMMUNITY)
Admission: EM | Admit: 2017-10-22 | Discharge: 2017-10-25 | DRG: 433 | Disposition: A | Discharge status: Hospice | Payer: Medicare HMO | Attending: Internal Medicine | Admitting: Internal Medicine

## 2017-10-22 ENCOUNTER — Ambulatory Visit: Payer: Self-pay | Admitting: *Deleted

## 2017-10-22 ENCOUNTER — Other Ambulatory Visit: Payer: Self-pay

## 2017-10-22 DIAGNOSIS — K7469 Other cirrhosis of liver: Principal | ICD-10-CM | POA: Diagnosis present

## 2017-10-22 DIAGNOSIS — R531 Weakness: Secondary | ICD-10-CM | POA: Diagnosis not present

## 2017-10-22 DIAGNOSIS — Z8673 Personal history of transient ischemic attack (TIA), and cerebral infarction without residual deficits: Secondary | ICD-10-CM | POA: Diagnosis not present

## 2017-10-22 DIAGNOSIS — Z809 Family history of malignant neoplasm, unspecified: Secondary | ICD-10-CM | POA: Diagnosis not present

## 2017-10-22 DIAGNOSIS — Z981 Arthrodesis status: Secondary | ICD-10-CM

## 2017-10-22 DIAGNOSIS — Z66 Do not resuscitate: Secondary | ICD-10-CM | POA: Diagnosis not present

## 2017-10-22 DIAGNOSIS — R8271 Bacteriuria: Secondary | ICD-10-CM | POA: Diagnosis present

## 2017-10-22 DIAGNOSIS — Z8249 Family history of ischemic heart disease and other diseases of the circulatory system: Secondary | ICD-10-CM

## 2017-10-22 DIAGNOSIS — B182 Chronic viral hepatitis C: Secondary | ICD-10-CM | POA: Diagnosis present

## 2017-10-22 DIAGNOSIS — E871 Hypo-osmolality and hyponatremia: Secondary | ICD-10-CM | POA: Diagnosis present

## 2017-10-22 DIAGNOSIS — R17 Unspecified jaundice: Secondary | ICD-10-CM

## 2017-10-22 DIAGNOSIS — R627 Adult failure to thrive: Secondary | ICD-10-CM | POA: Diagnosis present

## 2017-10-22 DIAGNOSIS — F419 Anxiety disorder, unspecified: Secondary | ICD-10-CM | POA: Diagnosis not present

## 2017-10-22 DIAGNOSIS — Z96643 Presence of artificial hip joint, bilateral: Secondary | ICD-10-CM | POA: Diagnosis present

## 2017-10-22 DIAGNOSIS — E860 Dehydration: Secondary | ICD-10-CM | POA: Diagnosis not present

## 2017-10-22 DIAGNOSIS — R180 Malignant ascites: Secondary | ICD-10-CM | POA: Diagnosis not present

## 2017-10-22 DIAGNOSIS — Z89021 Acquired absence of right finger(s): Secondary | ICD-10-CM

## 2017-10-22 DIAGNOSIS — R188 Other ascites: Secondary | ICD-10-CM | POA: Diagnosis not present

## 2017-10-22 DIAGNOSIS — D696 Thrombocytopenia, unspecified: Secondary | ICD-10-CM | POA: Diagnosis present

## 2017-10-22 DIAGNOSIS — C7951 Secondary malignant neoplasm of bone: Secondary | ICD-10-CM | POA: Diagnosis present

## 2017-10-22 DIAGNOSIS — C229 Malignant neoplasm of liver, not specified as primary or secondary: Secondary | ICD-10-CM | POA: Diagnosis not present

## 2017-10-22 DIAGNOSIS — Z79899 Other long term (current) drug therapy: Secondary | ICD-10-CM | POA: Diagnosis not present

## 2017-10-22 DIAGNOSIS — Y636 Underdosing and nonadministration of necessary drug, medicament or biological substance: Secondary | ICD-10-CM | POA: Diagnosis present

## 2017-10-22 DIAGNOSIS — C787 Secondary malignant neoplasm of liver and intrahepatic bile duct: Secondary | ICD-10-CM

## 2017-10-22 DIAGNOSIS — Z515 Encounter for palliative care: Secondary | ICD-10-CM | POA: Diagnosis not present

## 2017-10-22 DIAGNOSIS — C220 Liver cell carcinoma: Secondary | ICD-10-CM | POA: Diagnosis not present

## 2017-10-22 DIAGNOSIS — K7460 Unspecified cirrhosis of liver: Secondary | ICD-10-CM | POA: Diagnosis present

## 2017-10-22 DIAGNOSIS — Z880 Allergy status to penicillin: Secondary | ICD-10-CM

## 2017-10-22 DIAGNOSIS — T502X5A Adverse effect of carbonic-anhydrase inhibitors, benzothiadiazides and other diuretics, initial encounter: Secondary | ICD-10-CM | POA: Diagnosis not present

## 2017-10-22 DIAGNOSIS — E872 Acidosis, unspecified: Secondary | ICD-10-CM | POA: Diagnosis present

## 2017-10-22 DIAGNOSIS — N400 Enlarged prostate without lower urinary tract symptoms: Secondary | ICD-10-CM | POA: Diagnosis not present

## 2017-10-22 DIAGNOSIS — Z87891 Personal history of nicotine dependence: Secondary | ICD-10-CM

## 2017-10-22 DIAGNOSIS — K7290 Hepatic failure, unspecified without coma: Secondary | ICD-10-CM | POA: Diagnosis present

## 2017-10-22 DIAGNOSIS — Z882 Allergy status to sulfonamides status: Secondary | ICD-10-CM

## 2017-10-22 DIAGNOSIS — W19XXXA Unspecified fall, initial encounter: Secondary | ICD-10-CM | POA: Diagnosis not present

## 2017-10-22 DIAGNOSIS — K828 Other specified diseases of gallbladder: Secondary | ICD-10-CM | POA: Diagnosis not present

## 2017-10-22 DIAGNOSIS — R404 Transient alteration of awareness: Secondary | ICD-10-CM | POA: Diagnosis not present

## 2017-10-22 DIAGNOSIS — Z885 Allergy status to narcotic agent status: Secondary | ICD-10-CM

## 2017-10-22 HISTORY — DX: Malignant (primary) neoplasm, unspecified: C80.1

## 2017-10-22 LAB — BASIC METABOLIC PANEL
Anion gap: 14 (ref 5–15)
BUN: 27 mg/dL — AB (ref 6–20)
CHLORIDE: 84 mmol/L — AB (ref 98–111)
CO2: 26 mmol/L (ref 22–32)
CREATININE: 0.78 mg/dL (ref 0.61–1.24)
Calcium: 11.3 mg/dL — ABNORMAL HIGH (ref 8.9–10.3)
GFR calc Af Amer: >60 mL/min (ref >60)
GLUCOSE: 97 mg/dL (ref 70–99)
Potassium: 4.7 mmol/L (ref 3.5–5.1)
Sodium: 124 mmol/L — ABNORMAL LOW (ref 135–145)

## 2017-10-22 LAB — HEPATIC FUNCTION PANEL
ALBUMIN: 2.3 g/dL — AB (ref 3.5–5.0)
ALT: 112 U/L — ABNORMAL HIGH (ref 0–44)
AST: 383 U/L — ABNORMAL HIGH (ref 15–41)
Alkaline Phosphatase: 278 U/L — ABNORMAL HIGH (ref 38–126)
BILIRUBIN TOTAL: 7.9 mg/dL — AB (ref 0.3–1.2)
Bilirubin, Direct: 4.5 mg/dL — ABNORMAL HIGH (ref 0.0–0.2)
Indirect Bilirubin: 3.4 mg/dL — ABNORMAL HIGH (ref 0.3–0.9)
TOTAL PROTEIN: 7.7 g/dL (ref 6.5–8.1)

## 2017-10-22 LAB — URINALYSIS, ROUTINE W REFLEX MICROSCOPIC
Glucose, UA: NEGATIVE mg/dL
Hgb urine dipstick: NEGATIVE
Ketones, ur: NEGATIVE mg/dL
Leukocytes, UA: NEGATIVE
Nitrite: POSITIVE — AB
PROTEIN: NEGATIVE mg/dL
Specific Gravity, Urine: 1.021 (ref 1.005–1.030)
pH: 5 (ref 5.0–8.0)

## 2017-10-22 LAB — CBC
HEMATOCRIT: 43.2 % (ref 39.0–52.0)
Hemoglobin: 14.9 g/dL (ref 13.0–17.0)
MCH: 31.2 pg (ref 26.0–34.0)
MCHC: 34.5 g/dL (ref 30.0–36.0)
MCV: 90.4 fL (ref 78.0–100.0)
Platelets: 65 10*3/uL — ABNORMAL LOW (ref 150–400)
RBC: 4.78 MIL/uL (ref 4.22–5.81)
RDW: 18 % — AB (ref 11.5–15.5)
WBC: 7.7 10*3/uL (ref 4.0–10.5)

## 2017-10-22 LAB — I-STAT CG4 LACTIC ACID, ED: LACTIC ACID, VENOUS: 4.07 mmol/L — AB (ref 0.5–1.9)

## 2017-10-22 LAB — AMMONIA: AMMONIA: 22 umol/L (ref 9–35)

## 2017-10-22 LAB — CBG MONITORING, ED: GLUCOSE-CAPILLARY: 86 mg/dL (ref 70–99)

## 2017-10-22 MED ORDER — HYDROMORPHONE HCL 1 MG/ML IJ SOLN
1.0000 mg | INTRAMUSCULAR | Status: DC | PRN
  Administered 2017-10-23: 1 mg via INTRAVENOUS
  Filled 2017-10-22: qty 1

## 2017-10-22 MED ORDER — SODIUM CHLORIDE 0.9 % IV BOLUS
1000.0000 mL | Freq: Once | INTRAVENOUS | Status: AC
  Administered 2017-10-22: 1000 mL via INTRAVENOUS

## 2017-10-22 MED ORDER — BISACODYL 5 MG PO TBEC: 10.0000 mg | DELAYED_RELEASE_TABLET | Freq: Once | ORAL | Status: DC

## 2017-10-22 MED ORDER — LACTULOSE 10 GM/15ML PO SOLN
30.0000 g | Freq: Three times a day (TID) | ORAL | Status: DC
  Administered 2017-10-24 (×3): 30 g via ORAL
  Filled 2017-10-22 (×5): qty 45

## 2017-10-22 MED ORDER — SODIUM CHLORIDE 0.9 % IV SOLN
INTRAVENOUS | Status: DC
  Administered 2017-10-22: 19:00:00 via INTRAVENOUS

## 2017-10-22 MED ORDER — ALPRAZOLAM 0.5 MG PO TABS: 0.5000 mg | ORAL_TABLET | Freq: Every evening | ORAL | Status: DC | PRN

## 2017-10-22 MED ORDER — ENSURE ENLIVE PO LIQD
237.0000 mL | Freq: Two times a day (BID) | ORAL | Status: DC
  Administered 2017-10-24: 237 mL via ORAL

## 2017-10-22 MED ORDER — POLYETHYLENE GLYCOL 3350 17 G PO PACK
17.0000 g | PACK | Freq: Every day | ORAL | Status: DC
  Administered 2017-10-24: 17 g via ORAL
  Filled 2017-10-22: qty 1

## 2017-10-22 NOTE — ED Notes (Signed)
Bed: WA08 Expected date:  Expected time:  Means of arrival:  Comments: EMS-cancer patient/weak
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
## 2017-10-22 NOTE — ED Triage Notes (Signed)
Per EMS: Pt is coming from home. Pt lives in a boarding house and one of the other Tennants found pt sitting on the stairs. Pt reports he was too weak to walk the stairs. Pt's vitals WNL. Per son, the pt stopped "taking his medication" a while ago.  Pt and pt's son poor historian.

## 2017-10-22 NOTE — Patient Outreach (Addendum)
[facility]) Care Management  10/22/2017  [name] Jun 04, 1955 [id number]   Care coordination   Call attempts to reach [name] unsuccessful after a MD referral to [facility] unsuccessful outreach letter has been sent  Noted ED arrival on 10/22/17   Plan Pended for 3rd call attempt within 4 business days pending a possible return call from him   [place]. [name], RN, BSN, [place] Coordinator Office number [phone] Mobile number [phone]  Main [facility] number [phone] Fax number [phone]
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
## 2017-10-22 NOTE — Consult Note (Signed)
Palliative Care Consult Note Goals of care/Hospice Options  62 yo with widely metastatic hepatocellular carcinoma, liver tissue largely replaced by tumor and he has innumerable vertebral metastasis. On my evaluation he appears to be approaching EOL.  I met with patient and his brother, niece. He says that he is at peace with his life and ready to "be in heaven". He stopped eating and drinking or taking his medications at home prior to admission-he told his family he was tired of being sick. He was brought in after neighbors found him profoundly weak and at the bottom of steps at his residence.  He does endorse pain but can provide few details, family says he is very stoic. His abdomen is tense, affect is flat but he is not encephalopathic.  I discussed Hospice Care- he would benefit from a Hospice facility for care given his rapid decline. I anticipate need for more aggressive Symptom mangement soon.  Prognosis: days- <2 weeks  Recommendations:  1. Hospice Facility referral  2. Full comfort care, based on patients stated wishes and the fact that his disease is extremely advanced, the best we can offer him is dignity and comfort as he approaches EOL. 3. Spiritual Care Consult 4. Would de-escalate medical interventions unless related to his comfort. 5. Hydromorphone IV prn pain. 6. DNR  Lane Hacker, DO Palliative Medicine [phone]  Time: 50 min Greater than 50%  of this time was spent counseling and coordinating care related to the above assessment and plan.

## 2017-10-23 DIAGNOSIS — E871 Hypo-osmolality and hyponatremia: Secondary | ICD-10-CM

## 2017-10-23 LAB — COMPREHENSIVE METABOLIC PANEL
ALK PHOS: 224 U/L — AB (ref 38–126)
ALT: 89 U/L — ABNORMAL HIGH (ref 0–44)
ANION GAP: 9 (ref 5–15)
AST: 272 U/L — ABNORMAL HIGH (ref 15–41)
Albumin: 2 g/dL — ABNORMAL LOW (ref 3.5–5.0)
BILIRUBIN TOTAL: 7.3 mg/dL — AB (ref 0.3–1.2)
BUN: 22 mg/dL — ABNORMAL HIGH (ref 6–20)
CALCIUM: 10.5 mg/dL — AB (ref 8.9–10.3)
CO2: 25 mmol/L (ref 22–32)
Chloride: 95 mmol/L — ABNORMAL LOW (ref 98–111)
Creatinine, Ser: 0.44 mg/dL — ABNORMAL LOW (ref 0.61–1.24)
GFR calc non Af Amer: >60 mL/min (ref >60)
GLUCOSE: 81 mg/dL (ref 70–99)
Potassium: 4.7 mmol/L (ref 3.5–5.1)
Sodium: 129 mmol/L — ABNORMAL LOW (ref 135–145)
TOTAL PROTEIN: 6.6 g/dL (ref 6.5–8.1)

## 2017-10-23 MED ORDER — HYDROMORPHONE HCL 1 MG/ML IJ SOLN
0.5000 mg | INTRAMUSCULAR | Status: DC | PRN
  Administered 2017-10-24 – 2017-10-25 (×6): 0.5 mg via INTRAVENOUS
  Filled 2017-10-23 (×6): qty 1

## 2017-10-23 NOTE — Progress Notes (Signed)
Report given by Wyatt Portela, RN. Pt resting in bed, bed alarm activated. No acute issues during rounds. IVF infusing per order.

## 2017-10-23 NOTE — Progress Notes (Signed)
Bedside report given to Burman Nieves, RN

## 2017-10-23 NOTE — Progress Notes (Signed)
[facility]) RN Visit  Received referral from [place] that patient and family are interested in [facility].  Spoke with Lawerance Bach over the phone (she lives in [place]), advised that she was aware of that preference, her dad made the decision and he was familiar with [facility].  Advised her and him at this time we do not have a bed to offer.  Family and CSW aware that [facility] will continue to follow and update tomorrow or sooner, should a bed become available.    Thank you,  Venia Carbon BSN, [facility] Liaison [phone]

## 2017-10-23 NOTE — Progress Notes (Signed)
CSW notified that pt needing referral to hospice. Met with pt at bedside and discussed with PMT- pt/family have decided comfort measures only desired and residential hospice setting deemed appropriate given prognosis and care needs.  Pt prefers [facility] made referral and will follow.  Sharren Bridge, MSW, LCSW Clinical Social Work 10/23/2017 [phone]

## 2017-10-23 NOTE — Progress Notes (Signed)
PROGRESS NOTE    [name]   LFY:[id number]  DOB: 04/12/1955  DOA: 10/22/2017 PCP: Marton Redwood, MD   Brief Narrative:  [name] is a 62 y.o. 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  During prior hospitalization given ascites his diuretics doses were increased.  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    Subjective: Very sleepy. He has no complaints for me. Falls asleep easily and not able to hold a discussion with me.    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 Increased LFTs - LFTs improved today - the patient has decided again pursuing chemotherapy and is a candidate for hospice home at this point which he is preferring    Thrombocytopenia  - likely due to cirrhosis   DVT prophylaxis: SCDs Code Status: DNR Family Communication:  Disposition Plan: awaiting [facility] bed Consultants:   Palliative care Procedures:    Antimicrobials:  Anti-infectives (From admission, onward)   None      Objective: Vitals:   10/22/17 1534 10/22/17 2000 10/23/17 0604 10/23/17 1300  BP: 123/70 132/72 138/87   Pulse: 79 84 87   Resp: 18 16 20    Temp: 97.6 F (36.4  C) 97.9 F (36.6 C) 98.2 F (36.8 C)   TempSrc: Oral Oral Oral   SpO2: 92% 94% 95%   Weight:    88.9 kg  Height:    6' (1.829 m)    Intake/Output Summary (Last 24 hours) at 10/23/2017 1520 Last data filed at 10/23/2017 0700 Gross per 24 hour  Intake 830 ml  Output -  Net 830 ml   Filed Weights   10/23/17 1300  Weight: 88.9 kg    Examination: General exam: Appears comfortable - very sleepy HEENT: PERRLA, oral mucosa moist, no sclera icterus or thrush Respiratory system: Clear to auscultation. Respiratory effort normal. Cardiovascular system: S1 & S2 heard, RRR.   Gastrointestinal system: Abdomen soft, non-tender, moderately distended. Normal bowel sound. No organomegaly Extremities: No cyanosis, clubbing or edema Skin: No rashes or ulcers    Data Reviewed: I have personally reviewed following labs and imaging studies  CBC: Recent Labs  Lab 10/22/17 1144  WBC 7.7  HGB 14.9  HCT 43.2  MCV 90.4  PLT 65*   Basic Metabolic Panel: Recent Labs  Lab 10/22/17 1144 10/23/17 0725  NA 124* 129*  K 4.7 4.7  CL 84* 95*  CO2 26 25  GLUCOSE 97 81  BUN 27* 22*  CREATININE 0.78 0.44*  CALCIUM 11.3* 10.5*   GFR: Estimated Creatinine Clearance: 107.8 mL/min (A) (by C-G formula based on SCr of 0.44 mg/dL (L)). Liver Function Tests: Recent Labs  Lab 10/22/17 1201 10/23/17 0725  AST 383* 272*  ALT 112* 89*  ALKPHOS 278* 224*  BILITOT 7.9* 7.3*  PROT 7.7 6.6  ALBUMIN 2.3* 2.0*   No results for input(s): LIPASE, AMYLASE in the last 168 hours. Recent Labs  Lab 10/22/17 1200  AMMONIA 22   Coagulation Profile: No results for input(s): INR, PROTIME in the last 168 hours. Cardiac Enzymes: No results for input(s): CKTOTAL, CKMB, CKMBINDEX, TROPONINI in the last 168 hours. BNP (last 3 results) No results for input(s): PROBNP in the last 8760 hours. HbA1C: No results for input(s): HGBA1C in the last 72 hours. CBG: Recent Labs  Lab 10/22/17 1238  GLUCAP 86    Lipid Profile: No results for input(s): CHOL, HDL, LDLCALC, TRIG, CHOLHDL, LDLDIRECT in the last 72 hours. Thyroid Function Tests: No results for input(s): TSH, T4TOTAL, FREET4, T3FREE, THYROIDAB in the last 72 hours. Anemia Panel: No results for input(s): VITAMINB12, FOLATE, FERRITIN, TIBC, IRON, RETICCTPCT in the last 72 hours. Urine analysis:    Component Value Date/Time   COLORURINE AMBER (A) 10/22/2017 1358   APPEARANCEUR HAZY (A) 10/22/2017 1358   LABSPEC 1.021 10/22/2017 1358   PHURINE 5.0 10/22/2017 1358   GLUCOSEU NEGATIVE 10/22/2017 1358   HGBUR NEGATIVE 10/22/2017 1358   BILIRUBINUR MODERATE (A) 10/22/2017 1358   KETONESUR NEGATIVE 10/22/2017 1358   PROTEINUR NEGATIVE 10/22/2017 1358   UROBILINOGEN 0.2 06/26/2013 1316   NITRITE POSITIVE (A) 10/22/2017 1358   LEUKOCYTESUR NEGATIVE 10/22/2017 1358   Sepsis Labs: @LABRCNTIP (procalcitonin:4,lacticidven:4) )No results found for this or any previous visit (from the past 240 hour(s)).       Radiology Studies: Dg Chest 2 View  Result Date: 10/22/2017 CLINICAL DATA:  Weakness, poor historian, history of liver carcinoma and hepatitis-C, former smoking history EXAM: CHEST - 2 VIEW COMPARISON:  Chest x-ray of 10/10/2016 FINDINGS: There is a small pleural effusion present possibly on the left blunting the posterior costophrenic angle. However no pneumonia is seen. Mediastinal and hilar contours are unremarkable and heart size is stable. Lower anterior cervical spine fusion plate is noted. IMPRESSION: Small pleural effusion possibly on the left.  No pneumonia. Electronically Signed   By: Ivar Drape M.D.   On: 10/22/2017 12:25   Ct Head Wo Contrast  Result Date: 10/22/2017 CLINICAL DATA:  Patient with weakness EXAM: CT HEAD WITHOUT CONTRAST TECHNIQUE: Contiguous axial images were obtained from the base of the skull through the vertex without intravenous contrast. COMPARISON:  Brain CT 12/17/2014 FINDINGS: Brain: Ventricles and  sulci are appropriate for patient's age. No evidence for acute cortically based infarct, intracranial hemorrhage, mass lesion or mass-effect. Vascular: Unremarkable Skull: Intact. Sinuses/Orbits: Paranasal sinuses well aerated. Mastoid air cells unremarkable. Orbits unremarkable. Other: None. IMPRESSION: No acute intracranial process. Electronically Signed   By: Lovey Newcomer M.D.   On: 10/22/2017 12:36   US Abdomen Limited Ruq  Result Date: 10/22/2017 CLINICAL DATA:  Elevated liver enzymes EXAM: ULTRASOUND ABDOMEN LIMITED RIGHT UPPER QUADRANT COMPARISON:  Abdominal MRI October 12, 2017 FINDINGS: Gallbladder: There is sludge within the gallbladder. No gallstones are evident. There is gallbladder wall thickening and edema with slight pericholecystic fluid. Patient is tender over the gallbladder. No sonographic Murphy sign noted by sonographer. Common bile duct: Diameter: 7 mm. No biliary duct mass or calculus evident on this study. Liver: The liver has a nodular contour consistent with cirrhosis. The liver has a diffusely inhomogeneous echotexture with multiple nodular lesions throughout the liver, better appreciated on MR, consistent with widespread neoplasm involving the liver. There is moderate ascites. IMPRESSION: 1. Sludge in gallbladder. Edematous gallbladder wall with thickening and slight pericholecystic fluid. Patient is focally tender over the  gallbladder. These are findings that are concerning for acalculus cholecystitis. It must be noted that thickening of the gallbladder wall may be a consequence of ascites, although the appearance of the gallbladder overall is concerning for cholecystitis. 2.  No biliary duct dilatation evident. 3. The appearance of the liver is consistent with cirrhosis. Diffuse inhomogeneity of the liver with several nodular lesions is indicative of underlying neoplastic change, better delineated on recent MR. 4.  Moderate ascites evident. Electronically Signed   By: Lowella Grip III M.D.   On: 10/22/2017 18:54      Scheduled Meds: . bisacodyl  10 mg Oral Once  . feeding supplement (ENSURE ENLIVE)  237 mL Oral BID BM  . lactulose  30 g Oral TID  . polyethylene glycol  17 g Oral Daily   Continuous Infusions:   LOS: 1 day    Time spent in minutes: 35    Debbe Odea, MD [facility] Pager: [email or web address] Password [email or web address] 10/23/2017, 3:20 PM

## 2017-10-23 NOTE — Progress Notes (Signed)
Nutrition Brief Note  Chart reviewed. Pt now transitioning to comfort care.  No further nutrition interventions warranted at this time.  Please re-consult as needed.   Mahki Spikes RD, LDN Clinical Nutrition Pager # - [phone]    

## 2017-10-23 NOTE — Progress Notes (Signed)
PT Cancellation Note  Patient Details Name: ANTONI STEFAN MRN: [id number] DOB: 07/09/1955   Cancelled Treatment:    Reason Eval/Treat Not Completed: PT screened, no needs identified, will sign off. Palliative has recommended full comfort care and hospice facility referral. Will sign off. Recommend nursing mobilize pt as he wishes/is able to tolerate. Thanks.    Weston Anna, PT Acute Rehabilitation Services Pager: [phone] Office: [phone]

## 2017-10-24 NOTE — Progress Notes (Signed)
PT DAUGHTER TABITHA DEMPSTER STATES THAT IT IS OK FOR PT BROTHER GARY TO RECEIVE INFORMATION ABOUT PT CARE/PLANS.

## 2017-10-24 NOTE — Progress Notes (Signed)
PROGRESS NOTE    ARHAM SYMMONDS   GYJ:[id number]  DOB: Mar 19, 1955  DOA: 10/22/2017 PCP: Marton Redwood, MD   Brief Narrative:  [name] is a 62 y.o. 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  During prior hospitalization given ascites his diuretics doses were increased.  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    Subjective: Very sleepy again today. Tells me he is hungry.   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 Increased LFTs - LFTs improved a little - the patient has decided again pursuing chemotherapy and is a candidate for hospice home at this point which he is preferring    Thrombocytopenia  - likely due to cirrhosis   DVT prophylaxis: SCDs Code Status: DNR Family Communication:  Disposition Plan: awaiting [facility] bed Consultants:   Palliative care Procedures:    Antimicrobials:  Anti-infectives (From admission, onward)   None      Objective: Vitals:   10/22/17 2000 10/23/17 0604 10/23/17 1300 10/24/17 0500  BP: 132/72 138/87  122/80  Pulse: 84 87  93  Resp: 16 20  16   Temp: 97.9 F (36.6 C) 98.2 F (36.8 C)  97.9 F (36.6 C)  TempSrc: Oral  Oral  Oral  SpO2: 94% 95%  95%  Weight:   88.9 kg   Height:   6' (1.829 m)     Intake/Output Summary (Last 24 hours) at 10/24/2017 1256 Last data filed at 10/23/2017 1500 Gross per 24 hour  Intake 100 ml  Output -  Net 100 ml   Filed Weights   10/23/17 1300  Weight: 88.9 kg    Examination: General exam: Appears comfortable - very sleepy HEENT: PERRLA, oral mucosa moist, no sclera icterus or thrush Respiratory system: Clear to auscultation. Respiratory effort normal. Cardiovascular system: S1 & S2 heard, RRR.   Gastrointestinal system: Abdomen soft, non-tender, moderately distended again today. Normal bowel sound. No organomegaly Extremities: No cyanosis, clubbing or edema Skin: No rashes or ulcers    Data Reviewed: I have personally reviewed following labs and imaging studies  CBC: Recent Labs  Lab 10/22/17 1144  WBC 7.7  HGB 14.9  HCT 43.2  MCV 90.4  PLT 65*   Basic Metabolic Panel: Recent Labs  Lab 10/22/17 1144 10/23/17 0725  NA 124* 129*  K 4.7 4.7  CL 84* 95*  CO2 26 25  GLUCOSE 97 81  BUN 27* 22*  CREATININE 0.78 0.44*  CALCIUM 11.3* 10.5*   GFR: Estimated Creatinine Clearance: 107.8 mL/min (A) (by C-G formula based on SCr of 0.44 mg/dL (L)). Liver Function Tests: Recent Labs  Lab 10/22/17 1201 10/23/17 0725  AST 383* 272*  ALT 112* 89*  ALKPHOS 278* 224*  BILITOT 7.9* 7.3*  PROT 7.7 6.6  ALBUMIN 2.3* 2.0*   No results for input(s):  LIPASE, AMYLASE in the last 168 hours. Recent Labs  Lab 10/22/17 1200  AMMONIA 22   Coagulation Profile: No results for input(s): INR, PROTIME in the last 168 hours. Cardiac Enzymes: No results for input(s): CKTOTAL, CKMB, CKMBINDEX, TROPONINI in the last 168 hours. BNP (last 3 results) No results for input(s): PROBNP in the last 8760 hours. HbA1C: No results for input(s): HGBA1C in the last 72 hours. CBG: Recent Labs  Lab 10/22/17 1238  GLUCAP 86   Lipid Profile: No results for input(s): CHOL,  HDL, LDLCALC, TRIG, CHOLHDL, LDLDIRECT in the last 72 hours. Thyroid Function Tests: No results for input(s): TSH, T4TOTAL, FREET4, T3FREE, THYROIDAB in the last 72 hours. Anemia Panel: No results for input(s): VITAMINB12, FOLATE, FERRITIN, TIBC, IRON, RETICCTPCT in the last 72 hours. Urine analysis:    Component Value Date/Time   COLORURINE AMBER (A) 10/22/2017 1358   APPEARANCEUR HAZY (A) 10/22/2017 1358   LABSPEC 1.021 10/22/2017 1358   PHURINE 5.0 10/22/2017 1358   GLUCOSEU NEGATIVE 10/22/2017 1358   HGBUR NEGATIVE 10/22/2017 1358   BILIRUBINUR MODERATE (A) 10/22/2017 1358   KETONESUR NEGATIVE 10/22/2017 1358   PROTEINUR NEGATIVE 10/22/2017 1358   UROBILINOGEN 0.2 06/26/2013 1316   NITRITE POSITIVE (A) 10/22/2017 1358   LEUKOCYTESUR NEGATIVE 10/22/2017 1358   Sepsis Labs: @LABRCNTIP (procalcitonin:4,lacticidven:4) )No results found for this or any previous visit (from the past 240 hour(s)).       Radiology Studies: US Abdomen Limited Ruq  Result Date: 10/22/2017 CLINICAL DATA:  Elevated liver enzymes EXAM: ULTRASOUND ABDOMEN LIMITED RIGHT UPPER QUADRANT COMPARISON:  Abdominal MRI October 12, 2017 FINDINGS: Gallbladder: There is sludge within the gallbladder. No gallstones are evident. There is gallbladder wall thickening and edema with slight pericholecystic fluid. Patient is tender over the gallbladder. No sonographic Murphy sign noted by sonographer. Common bile duct: Diameter: 7 mm. No biliary duct mass or calculus evident on this study. Liver: The liver has a nodular contour consistent with cirrhosis. The liver has a diffusely inhomogeneous echotexture with multiple nodular lesions throughout the liver, better appreciated on MR, consistent with widespread neoplasm involving the liver. There is moderate ascites. IMPRESSION: 1. Sludge in gallbladder. Edematous gallbladder wall with thickening and slight pericholecystic fluid. Patient is focally tender over the gallbladder. These  are findings that are concerning for acalculus cholecystitis. It must be noted that thickening of the gallbladder wall may be a consequence of ascites, although the appearance of the gallbladder overall is concerning for cholecystitis. 2.  No biliary duct dilatation evident. 3. The appearance of the liver is consistent with cirrhosis. Diffuse inhomogeneity of the liver with several nodular lesions is indicative of underlying neoplastic change, better delineated on recent MR. 4.  Moderate ascites evident. Electronically Signed   By: Lowella Grip III M.D.   On: 10/22/2017 18:54      Scheduled Meds: . bisacodyl  10 mg Oral Once  . feeding supplement (ENSURE ENLIVE)  237 mL Oral BID BM  . lactulose  30 g Oral TID  . polyethylene glycol  17 g Oral Daily   Continuous Infusions:   LOS: 2 days    Time spent in minutes: 35    Debbe Odea, MD [facility] Pager: [email or web address] Password [facility] 10/24/2017, 12:56 PM

## 2017-10-24 NOTE — Progress Notes (Signed)
Patient's neighbor Myrtie Soman at bedside, reports patient requested he take his wallet home with him.  RN spoke with patient's daughter, Cassandria Santee, who confirms this is ok. Richard's contact numbers [phone] or [phone].

## 2017-10-24 NOTE — Progress Notes (Signed)
[facility]): Hospital Liaison Visit  Received request from [place] for family interest in [facility] with request for transfer tomorrow (10/25/2017). Chart reviewed and received report from bedside RN. Met with patient who provided permission to speak with his daughter Debara Pickett to confirm interest and explained services. Daughter agreeable to transfer tomorrow (10/25/2017). CSW aware. Registration paper work completed telephonically. Dr. Orpah Melter to assume care per daughter's request. Please fax discharge summary to [phone]. RN please call report to [phone]. CSW please arrange transport for patient to arrive tomorrow morning 10/25/2017.  Thank You,  Raina Mina, RN, BSN [facility] Liaison [phone]  [place] are listed on AMION

## 2017-10-25 DIAGNOSIS — R180 Malignant ascites: Secondary | ICD-10-CM

## 2017-10-25 DIAGNOSIS — D696 Thrombocytopenia, unspecified: Secondary | ICD-10-CM

## 2017-10-25 DIAGNOSIS — R17 Unspecified jaundice: Secondary | ICD-10-CM

## 2017-10-25 DIAGNOSIS — N400 Enlarged prostate without lower urinary tract symptoms: Secondary | ICD-10-CM

## 2017-10-25 DIAGNOSIS — C787 Secondary malignant neoplasm of liver and intrahepatic bile duct: Secondary | ICD-10-CM

## 2017-10-25 DIAGNOSIS — E860 Dehydration: Secondary | ICD-10-CM

## 2017-10-25 DIAGNOSIS — C7951 Secondary malignant neoplasm of bone: Secondary | ICD-10-CM

## 2017-10-25 DIAGNOSIS — E872 Acidosis: Secondary | ICD-10-CM

## 2017-10-25 DIAGNOSIS — K7290 Hepatic failure, unspecified without coma: Secondary | ICD-10-CM

## 2017-10-25 MED ORDER — MORPHINE SULFATE (CONCENTRATE) 10 MG/0.5ML PO SOLN
5.0000 mg | ORAL | Status: DC | PRN
  Administered 2017-10-25: 5 mg via ORAL
  Filled 2017-10-25: qty 0.5

## 2017-10-25 NOTE — Progress Notes (Signed)
Foley catheter placed per MD order, patient tolerated procedure well. Clear dark yellow urine returned.

## 2017-10-25 NOTE — Care Management Important Message (Signed)
Important Message  Patient Details  Name: HAN VEJAR MRN: [id number] Date of Birth: [birth year]   Medicare Important Message Given:  Yes    Erenest Rasher, RN 10/25/2017, 9:23 AM

## 2017-10-25 NOTE — Discharge Summary (Signed)
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 NEGATIVE 10/22/2017 1358   UROBILINOGEN 0.2 06/26/2013 1316   NITRITE POSITIVE (A) 10/22/2017 1358   LEUKOCYTESUR NEGATIVE 10/22/2017 1358   Sepsis Labs Invalid input(s): PROCALCITONIN,  WBC,  LACTICIDVEN Microbiology No results found for this or any previous visit (from the past 240 hour(s)).   Time coordinating discharge in minutes: 60  SIGNED:   Debbe Odea, MD  [facility] 10/25/2017, 11:37 AM Pager   If 7PM-7AM, please contact night-coverage [email or web address] Password [email or web address]

## 2017-10-25 NOTE — Progress Notes (Signed)
Patient discharging to [facility]. CSW faxed appropriate documents. Facility expecting his arrival.  Corey Harold has been called transport.  RN call report to: [phone].  No more CSW needs. Signing off.   Pricilla Holm, MSW, [place] [employer] [phone]

## 2017-10-26 ENCOUNTER — Ambulatory Visit: Payer: Self-pay | Admitting: *Deleted

## 2017-10-27 ENCOUNTER — Other Ambulatory Visit: Payer: Self-pay | Admitting: *Deleted

## 2017-10-28 ENCOUNTER — Other Ambulatory Visit: Payer: Medicare HMO

## 2017-10-28 ENCOUNTER — Other Ambulatory Visit: Payer: Self-pay | Admitting: *Deleted

## 2017-10-28 NOTE — Patient Outreach (Signed)
[place] [facility]) Care Management  10/28/2017  [name] [birth year] [id number]   Telephone Screen  Referral Date:10/16/17 Referral Source:MD referral Referral Reason:Liver Mass, muscle weakness, protein clorie nutrition,high change of being admitted to hospital [place] attempt #3 unsuccessful at the number in Epic listed as both the home and mobile number No answer. [facility] RN CM left HIPAA compliant voicemail message along with CM's contact info.  Noted in Epic to have 2 admissions after presenting to ED and 1 ED visit in the last 6 months - ED for ascites, on 10/02/17, on 10/05/17 hospitalized for generalized abdominal pain and on 10/10/17 hospitalized for ascites 10/22/17 to 10/25/17     Social: Conditions:atherosclerosis of aorta, HTN, hyperlipidemia, CVA, neck pain , hepatitis, nicotine addiction, syncope, obesity, generalized anxiety disorder, hx of alcoholism, chronic back pain, carpal tunnel syndrome, obesity, in remission nicotine addiction, disabled due to hips and neck   Plan: [facility] RN CM scheduled this patient for case closure  Review of Epic indicates 10/25/17 d/c to Hospice and [facility]):    [employer] [name], RN, BSN, [place] Coordinator Office number [phone] Mobile number [phone]  Main [facility] number [phone] Fax number [phone]

## 2017-10-28 NOTE — Patient Outreach (Signed)
[place] [facility]) Care Management  10/28/2017  [name] 30-May-1955 [id number]   Opened with error  [name] [name], RN, BSN, [place] Coordinator Office number [phone] Mobile number [phone]  Main [facility] number [phone] Fax number [phone]

## 2017-11-03 DEATH — deceased

## 2017-11-04 ENCOUNTER — Other Ambulatory Visit: Payer: Self-pay | Admitting: *Deleted

## 2017-11-04 NOTE — Patient Outreach (Signed)
[place] [facility]) Care Management  11/04/2017  [name] [birth year] [id number]   Case closure   Call attempts made on 10/20/17, 10/21/17 and 10/28/17 Unsuccessful outreach letter sent on 10/20/17 without a response   Plan [facility] RN CM will close case after no response from patient within 10 business days. Review of Epic indicates 10/25/17 d/c to Hospice and [facility]):  Consumer enrolled in an external program and Unable to reach  [name] . [name], RN, BSN, [place] Coordinator Office number [phone] Mobile number [phone]  Main [facility] number [phone] Fax number [phone]

## 2018-04-03 ENCOUNTER — Encounter: Payer: Self-pay | Admitting: Internal Medicine
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
## 2019-09-02 IMAGING — MR MR ABDOMEN WO/W CM
9 of 18 series · 19 of 48 positions shown · IV contrast (gadavist)
Comparison: 10/11/2017 paracentesis.  10/10/2017 CT abdomen/pelvis.

CLINICAL DATA: Inpatient. Cirrhosis. Indeterminate low-attenuation
liver lesions on CT. Paracentesis 1 day prior. AFP level pending.

EXAM:
MRI ABDOMEN WITHOUT AND WITH CONTRAST
TECHNIQUE: Multiplanar multisequence MR imaging of the abdomen was performed
both before and after the administration of intravenous contrast.
CONTRAST:  9 cc Gadavist IV.

[Series 3: T2 fat-sat · axial · 5.0mm · 0.78mm/px · z∈[-137,+173]mm · 2 of 63 slices shown]
[im 1/63]
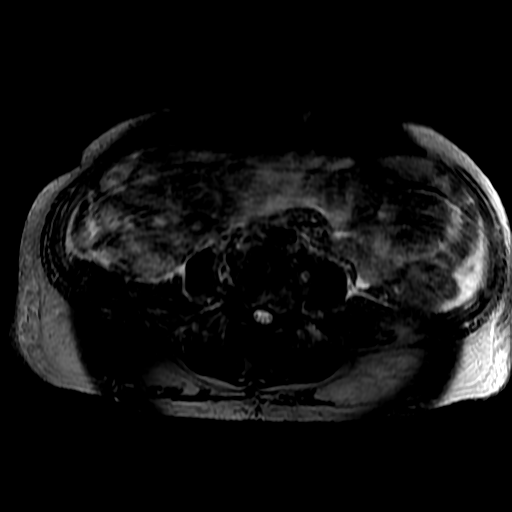
[im 63/63]
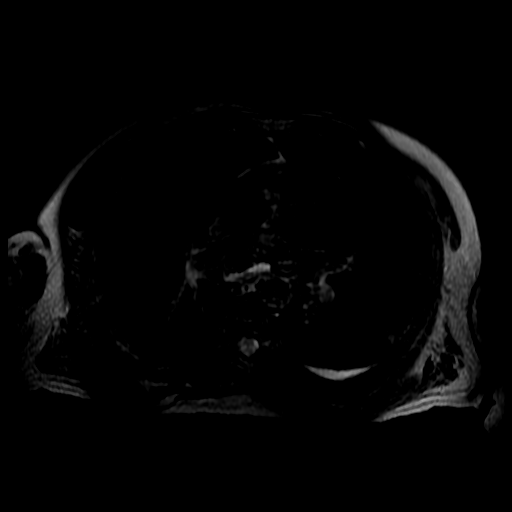

[Series 4: DWI b500 · axial · 6.0mm · 1.56mm/px · z∈[-136,+152]mm · 2 of 74 slices shown]
[im 1/74]
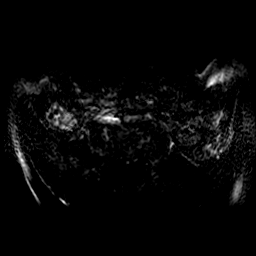
[im 74/74]
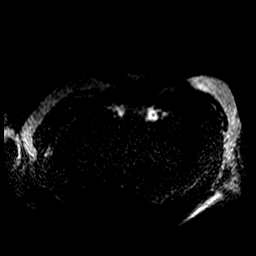

[Series 5: T2 · axial · 5.0mm · 0.78mm/px · z∈[-137,+173]mm · 2 of 63 slices shown (1 of 2)]
[im 1/63]
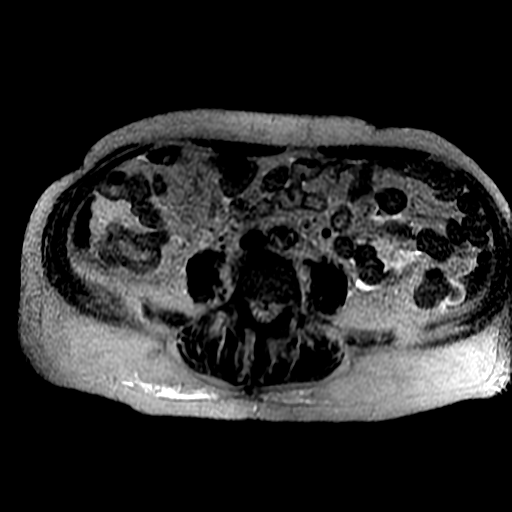
[im 63/63]
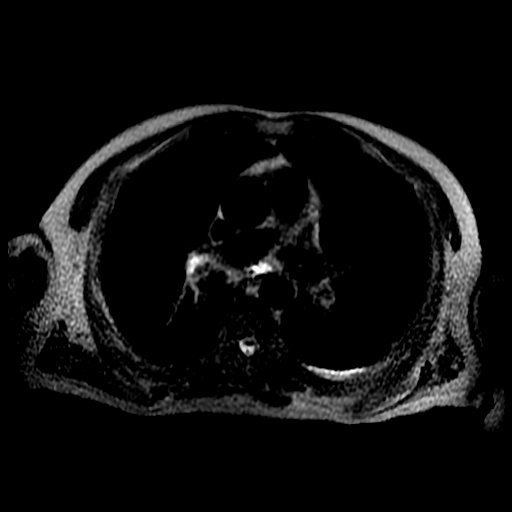

[Series 6: T2 · coronal · 5.0mm · 0.78mm/px · 2 of 55 slices shown (2 of 2)]
[im 1/55]
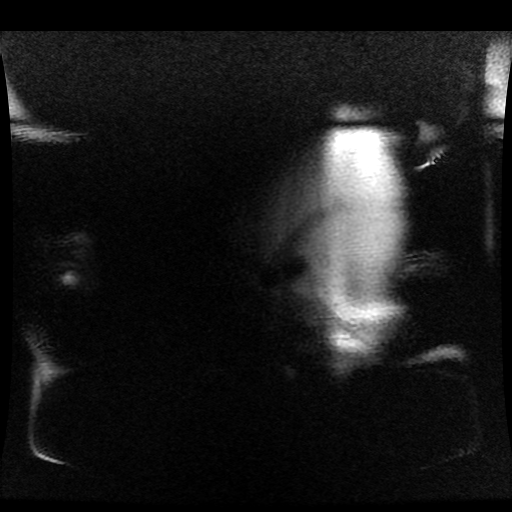
[im 55/55]
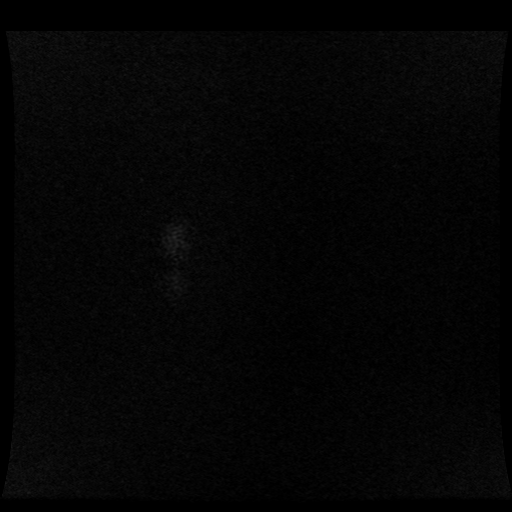

[Series 7: bSSFP · axial · 5.0mm · 0.78mm/px · z∈[-137,+173]mm · 2 of 63 slices shown]
[im 1/63]
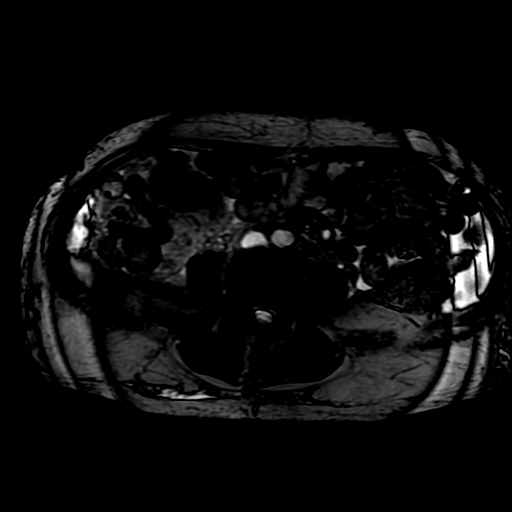
[im 63/63]
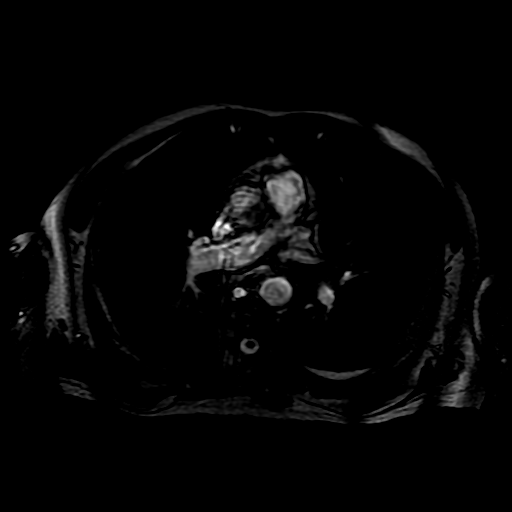

[Series 8: ax dualecho · axial · 5.0mm · 0.78mm/px · z∈[-137,+173]mm · 4 of 126 slices shown]
[im 1/126]
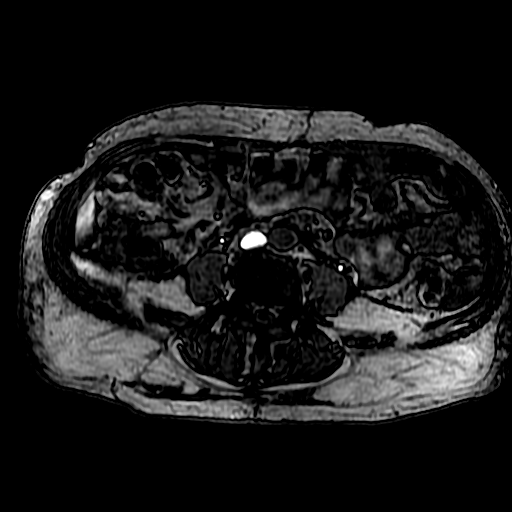
[im 42/126]
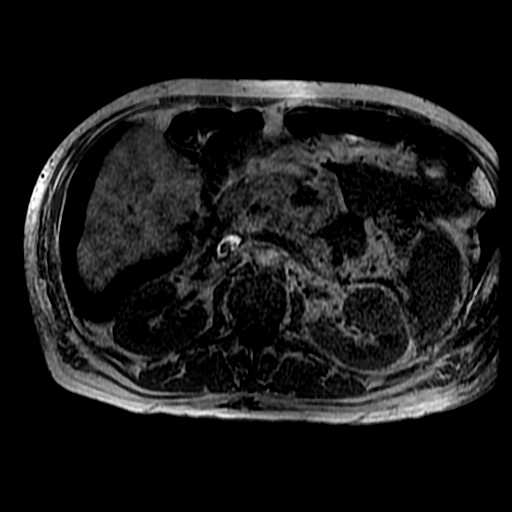
[im 84/126]
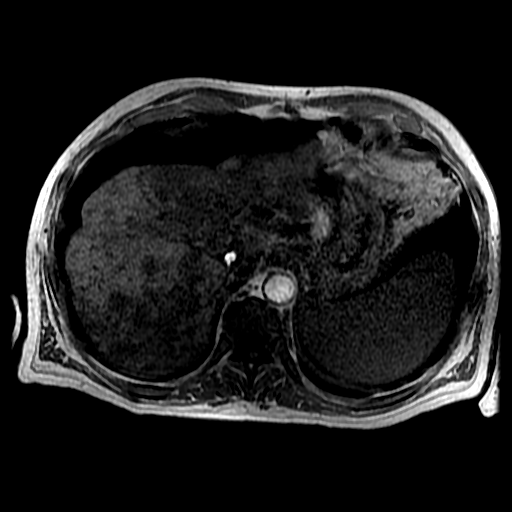
[im 126/126]
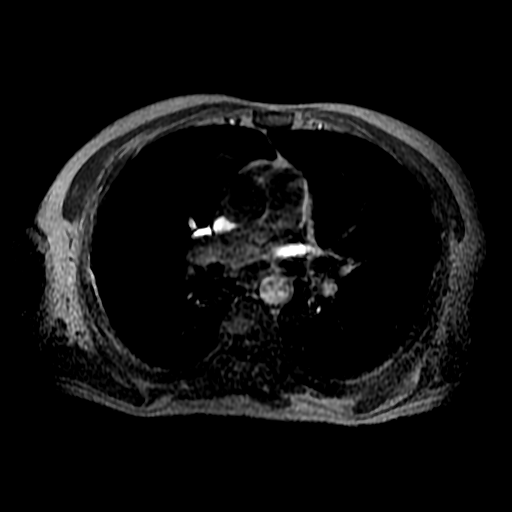

[Series 400: DWI · axial · 6.0mm · 1.56mm/px · 1 of 38 slices shown]
[im 1/38]
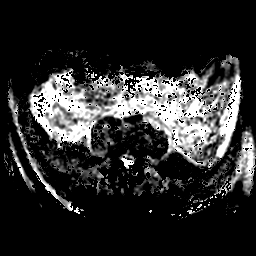

[Series 900: T1 dynamic · axial · 5.6mm · 0.78mm/px · z∈[-135,+154]mm · 3 of 104 slices shown (1 of 2)]
[im 1/104]
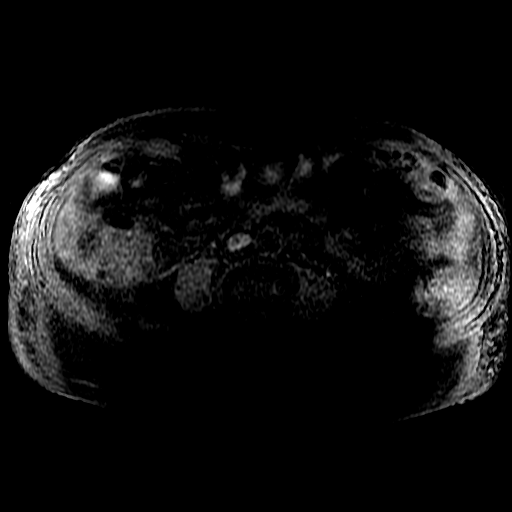
[im 52/104]
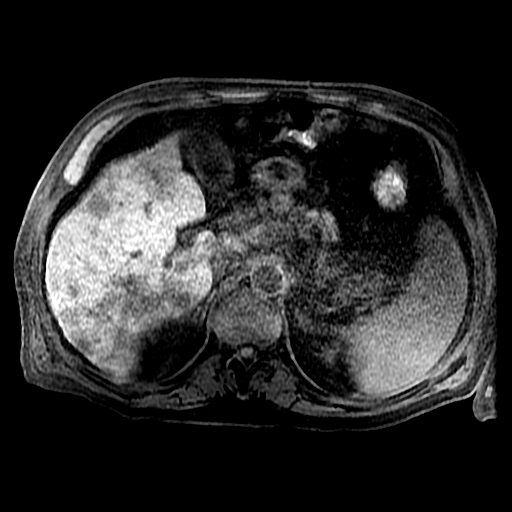
[im 104/104]
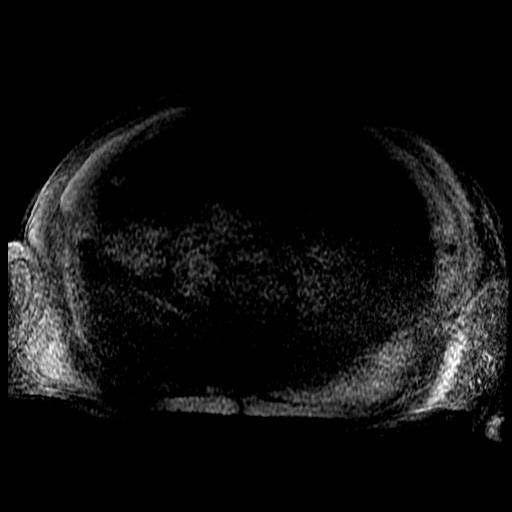

[Series 901: T1 dynamic · axial · 5.6mm · 0.78mm/px · 1 of 104 slices shown (2 of 2)]
[im 1/104]
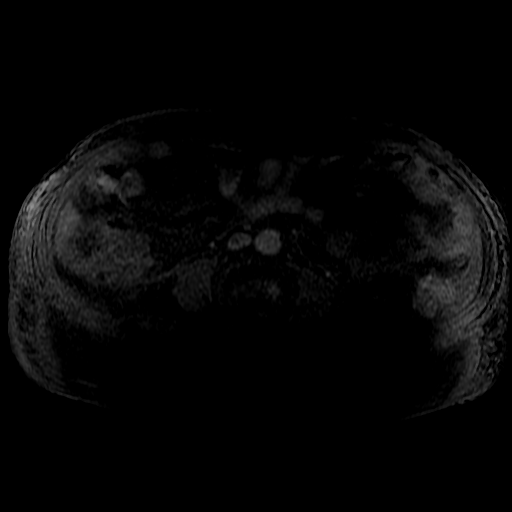

[19 of 48 positions shown; findings below may reference images not displayed]

FINDINGS: Lower chest: Subsegmental scarring versus atelectasis at the
dependent left lung base. Enlarged 1.2 cm anterior pericardiophrenic
node (series 905/image 25).

Hepatobiliary: Liver surface is diffusely irregular, compatible with
hepatic cirrhosis. No hepatic steatosis. The liver parenchyma is
largely replaced by innumerable confluent hyperenhancing liver
masses, each demonstrating T2 hyperintensity, T1 hypointensity,
restricted diffusion and targetoid hyperenhancement. Representative
2.7 x 2.5 cm segment 6 right liver lobe mass (series 901/image 56).
Representative 2.5 x 1.9 cm segment 3 left liver lobe mass (series
901/image 46). Representative far inferior right liver lobe 2.4 x
2.3 cm mass (series 901/image 88). No cholelithiasis. Mild diffuse
gallbladder wall thickening. No biliary ductal dilatation. Common
bile duct diameter 5 mm. No choledocholithiasis.

Pancreas: No pancreatic mass or duct dilation.  No pancreas divisum.

Spleen: Moderate splenomegaly (craniocaudal splenic length 15.5 cm).
No splenic mass.

Adrenals/Urinary Tract: Normal adrenals. No hydronephrosis. Normal
kidneys with no renal mass.

Stomach/Bowel: Normal non-distended stomach. Visualized small and
large bowel is normal caliber, with no bowel wall thickening.

Vascular/Lymphatic: Atherosclerotic nonaneurysmal abdominal aorta.
Patent portal, splenic, hepatic and renal veins. Moderate
gastroesophageal and paraumbilical varices. Enlarged 1.5 cm porta
hepatis node (series 905/image 63). Enlarged 1.5 cm gastrohepatic
ligament node (series 905/image 50).

Other: Small to moderate volume abdominal ascites.

Musculoskeletal: Several enhancing vertebral lesions throughout the
visualized spine, largest 1.9 cm (series 901/image 58), probably
within the posterior L1 vertebral body. Several enhancing bilateral
rib lesions, for example within a posterior lower right rib (series
901/image 37) and within a lateral left rib (series 901/image 27).
IMPRESSION: 1. Hepatic cirrhosis. Liver parenchyma largely replaced by
innumerable confluent hyperenhancing liver masses, compatible with
widespread liver malignancy. Poorly differentiated multifocal
hepatocellular carcinoma is most likely. The targetoid enhancement
and low T1 attenuation of these liver masses is also compatible with
widespread liver metastases from a non-hepatic primary malignancy.
2. Nonspecific porta hepatis, gastrohepatic ligament and
pericardiophrenic adenopathy, cannot exclude nodal metastases.
3. Numerous enhancing osseous lesions in the visualized spine and
bilateral lower ribs, compatible with widespread osseous metastatic
disease.
4. Secondary findings of portal hypertension, including small to
moderate volume ascites, moderate gastroesophageal and
paraesophageal varices, and moderate splenomegaly.
5.  Aortic Atherosclerosis ([id number]).
